# Patient Record
Sex: Female | Born: 1985 | Race: White | Hispanic: No | Marital: Single | Smoking: Never smoker
Health system: Southern US, Community
[De-identification: ages and names within clinical notes are randomized; demographics above are authoritative.]

## PROBLEM LIST (undated history)

## (undated) DIAGNOSIS — R740 Nonspecific elevation of levels of transaminase and lactic acid dehydrogenase [LDH]: Secondary | ICD-10-CM

## (undated) DIAGNOSIS — IMO0002 Reserved for concepts with insufficient information to code with codable children: Secondary | ICD-10-CM

## (undated) DIAGNOSIS — J302 Other seasonal allergic rhinitis: Secondary | ICD-10-CM

## (undated) DIAGNOSIS — D649 Anemia, unspecified: Secondary | ICD-10-CM

## (undated) DIAGNOSIS — R87619 Unspecified abnormal cytological findings in specimens from cervix uteri: Secondary | ICD-10-CM

## (undated) DIAGNOSIS — K900 Celiac disease: Secondary | ICD-10-CM

## (undated) DIAGNOSIS — F329 Major depressive disorder, single episode, unspecified: Secondary | ICD-10-CM

## (undated) DIAGNOSIS — S62101A Fracture of unspecified carpal bone, right wrist, initial encounter for closed fracture: Secondary | ICD-10-CM

## (undated) DIAGNOSIS — E039 Hypothyroidism, unspecified: Secondary | ICD-10-CM

## (undated) DIAGNOSIS — K589 Irritable bowel syndrome without diarrhea: Secondary | ICD-10-CM

## (undated) DIAGNOSIS — E063 Autoimmune thyroiditis: Secondary | ICD-10-CM

## (undated) DIAGNOSIS — F32A Depression, unspecified: Secondary | ICD-10-CM

## (undated) DIAGNOSIS — G43109 Migraine with aura, not intractable, without status migrainosus: Secondary | ICD-10-CM

## (undated) HISTORY — DX: Migraine with aura, not intractable, without status migrainosus: G43.109

## (undated) HISTORY — DX: Autoimmune thyroiditis: E06.3

## (undated) HISTORY — DX: Reserved for concepts with insufficient information to code with codable children: IMO0002

## (undated) HISTORY — DX: Irritable bowel syndrome without diarrhea: K58.9

## (undated) HISTORY — DX: Anemia, unspecified: D64.9

## (undated) HISTORY — DX: Hypothyroidism, unspecified: E03.9

## (undated) HISTORY — DX: Nonspecific elevation of levels of transaminase and lactic acid dehydrogenase (ldh): R74.0

## (undated) HISTORY — DX: Celiac disease: K90.0

## (undated) HISTORY — DX: Fracture of unspecified carpal bone, right wrist, initial encounter for closed fracture: S62.101A

## (undated) HISTORY — DX: Other seasonal allergic rhinitis: J30.2

## (undated) HISTORY — PX: WISDOM TOOTH EXTRACTION: SHX21

## (undated) HISTORY — DX: Major depressive disorder, single episode, unspecified: F32.9

## (undated) HISTORY — PX: COLPOSCOPY: SHX161

## (undated) HISTORY — DX: Depression, unspecified: F32.A

## (undated) HISTORY — DX: Unspecified abnormal cytological findings in specimens from cervix uteri: R87.619

---
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
## 2009-05-16 ENCOUNTER — Ambulatory Visit: Payer: Self-pay | Admitting: Internal Medicine

## 2009-05-16 DIAGNOSIS — D509 Iron deficiency anemia, unspecified: Secondary | ICD-10-CM

## 2009-05-16 DIAGNOSIS — Z9189 Other specified personal risk factors, not elsewhere classified: Secondary | ICD-10-CM | POA: Insufficient documentation

## 2009-05-16 DIAGNOSIS — J302 Other seasonal allergic rhinitis: Secondary | ICD-10-CM | POA: Insufficient documentation

## 2009-05-16 DIAGNOSIS — G479 Sleep disorder, unspecified: Secondary | ICD-10-CM | POA: Insufficient documentation

## 2009-05-16 DIAGNOSIS — M7120 Synovial cyst of popliteal space [Baker], unspecified knee: Secondary | ICD-10-CM | POA: Insufficient documentation

## 2009-05-20 ENCOUNTER — Encounter (INDEPENDENT_AMBULATORY_CARE_PROVIDER_SITE_OTHER): Payer: Self-pay | Admitting: *Deleted

## 2009-05-20 LAB — CONVERTED CEMR LAB
ALT: 110 units/L — ABNORMAL HIGH (ref 0–35)
AST: 66 units/L — ABNORMAL HIGH (ref 0–37)
Albumin: 4 g/dL (ref 3.5–5.2)
Alkaline Phosphatase: 78 units/L (ref 39–117)
BUN: 10 mg/dL (ref 6–23)
Basophils Absolute: 0 10*3/uL (ref 0.0–0.1)
Basophils Relative: 0.6 % (ref 0.0–3.0)
Bilirubin, Direct: 0 mg/dL (ref 0.0–0.3)
CO2: 20 meq/L (ref 19–32)
Calcium: 9.9 mg/dL (ref 8.4–10.5)
Chloride: 104 meq/L (ref 96–112)
Cholesterol: 196 mg/dL (ref 0–200)
Creatinine, Ser: 0.6 mg/dL (ref 0.4–1.2)
Eosinophils Absolute: 0.1 10*3/uL (ref 0.0–0.7)
Eosinophils Relative: 1.3 % (ref 0.0–5.0)
GFR calc non Af Amer: 133.74 mL/min (ref >60)
Glucose, Bld: 80 mg/dL (ref 70–99)
HCT: 41.8 % (ref 36.0–46.0)
HDL: 47.9 mg/dL (ref >39.00)
Hemoglobin: 14.2 g/dL (ref 12.0–15.0)
LDL Cholesterol: 126 mg/dL — ABNORMAL HIGH (ref 0–99)
Lymphocytes Relative: 20.4 % (ref 12.0–46.0)
Lymphs Abs: 1.1 10*3/uL (ref 0.7–4.0)
MCHC: 34 g/dL (ref 30.0–36.0)
MCV: 88.3 fL (ref 78.0–100.0)
Monocytes Absolute: 0.8 10*3/uL (ref 0.1–1.0)
Monocytes Relative: 14 % — ABNORMAL HIGH (ref 3.0–12.0)
Neutro Abs: 3.4 10*3/uL (ref 1.4–7.7)
Neutrophils Relative %: 63.7 % (ref 43.0–77.0)
Platelets: 247 10*3/uL (ref 150.0–400.0)
Potassium: 4.7 meq/L (ref 3.5–5.1)
RBC: 4.73 M/uL (ref 3.87–5.11)
RDW: 11.5 % (ref 11.5–14.6)
Sodium: 140 meq/L (ref 135–145)
TSH: 7.72 microintl units/mL — ABNORMAL HIGH (ref 0.35–5.50)
Total Bilirubin: 0.4 mg/dL (ref 0.3–1.2)
Total CHOL/HDL Ratio: 4
Total Protein: 7.5 g/dL (ref 6.0–8.3)
Triglycerides: 113 mg/dL (ref 0.0–149.0)
VLDL: 22.6 mg/dL (ref 0.0–40.0)
WBC: 5.4 10*3/uL (ref 4.5–10.5)

## 2009-06-13 ENCOUNTER — Encounter (INDEPENDENT_AMBULATORY_CARE_PROVIDER_SITE_OTHER): Payer: Self-pay | Admitting: *Deleted

## 2009-06-13 ENCOUNTER — Ambulatory Visit: Payer: Self-pay | Admitting: Internal Medicine

## 2009-06-16 ENCOUNTER — Telehealth (INDEPENDENT_AMBULATORY_CARE_PROVIDER_SITE_OTHER): Payer: Self-pay | Admitting: *Deleted

## 2009-06-16 LAB — CONVERTED CEMR LAB
ALT: 162 units/L — ABNORMAL HIGH (ref 0–35)
AST: 86 units/L — ABNORMAL HIGH (ref 0–37)
Free T4: 0.7 ng/dL (ref 0.6–1.6)
T3, Free: 4.5 pg/mL — ABNORMAL HIGH (ref 2.3–4.2)
TSH: 13.92 microintl units/mL — ABNORMAL HIGH (ref 0.35–5.50)

## 2009-06-18 ENCOUNTER — Ambulatory Visit: Payer: Self-pay | Admitting: Internal Medicine

## 2009-06-18 DIAGNOSIS — R740 Nonspecific elevation of levels of transaminase and lactic acid dehydrogenase [LDH]: Secondary | ICD-10-CM

## 2009-06-18 DIAGNOSIS — R7401 Elevation of levels of liver transaminase levels: Secondary | ICD-10-CM | POA: Insufficient documentation

## 2009-06-18 DIAGNOSIS — E039 Hypothyroidism, unspecified: Secondary | ICD-10-CM

## 2009-08-12 ENCOUNTER — Ambulatory Visit: Payer: Self-pay | Admitting: Internal Medicine

## 2009-08-15 ENCOUNTER — Encounter (INDEPENDENT_AMBULATORY_CARE_PROVIDER_SITE_OTHER): Payer: Self-pay | Admitting: *Deleted

## 2009-08-15 LAB — CONVERTED CEMR LAB
ALT: 24 units/L (ref 0–35)
AST: 19 units/L (ref 0–37)
Albumin: 3.8 g/dL (ref 3.5–5.2)
Alkaline Phosphatase: 56 units/L (ref 39–117)
Bilirubin, Direct: 0 mg/dL (ref 0.0–0.3)
TSH: 4.07 microintl units/mL (ref 0.35–5.50)
Total Bilirubin: 0.7 mg/dL (ref 0.3–1.2)
Total Protein: 7.5 g/dL (ref 6.0–8.3)

## 2009-08-18 ENCOUNTER — Ambulatory Visit: Payer: Self-pay | Admitting: Internal Medicine

## 2009-09-29 ENCOUNTER — Encounter: Admission: RE | Admit: 2009-09-29 | Discharge: 2009-09-29 | Payer: Self-pay | Admitting: Infectious Diseases

## 2010-02-16 ENCOUNTER — Ambulatory Visit: Payer: Self-pay | Admitting: Internal Medicine

## 2010-02-23 LAB — CONVERTED CEMR LAB
ALT: 23 units/L (ref 0–35)
AST: 21 units/L (ref 0–37)
Albumin: 4.3 g/dL (ref 3.5–5.2)
Alkaline Phosphatase: 61 units/L (ref 39–117)
Bilirubin, Direct: 0.1 mg/dL (ref 0.0–0.3)
TSH: 3.65 microintl units/mL (ref 0.35–5.50)
Total Bilirubin: 0.7 mg/dL (ref 0.3–1.2)
Total Protein: 7.4 g/dL (ref 6.0–8.3)

## 2010-07-20 ENCOUNTER — Ambulatory Visit: Payer: Self-pay | Admitting: Internal Medicine

## 2010-07-20 DIAGNOSIS — M25569 Pain in unspecified knee: Secondary | ICD-10-CM

## 2010-07-20 DIAGNOSIS — R7401 Elevation of levels of liver transaminase levels: Secondary | ICD-10-CM

## 2010-07-20 HISTORY — DX: Elevation of levels of liver transaminase levels: R74.01

## 2010-07-20 LAB — CONVERTED CEMR LAB
Cholesterol, target level: 200 mg/dL
HDL goal, serum: 40 mg/dL
LDL Goal: 160 mg/dL

## 2010-07-27 LAB — CONVERTED CEMR LAB
ALT: 47 units/L — ABNORMAL HIGH (ref 0–35)
AST: 30 units/L (ref 0–37)
BUN: 10 mg/dL (ref 6–23)
Basophils Relative: 0.3 % (ref 0.0–3.0)
Bilirubin, Direct: 0.1 mg/dL (ref 0.0–0.3)
Chloride: 102 meq/L (ref 96–112)
Cholesterol: 209 mg/dL — ABNORMAL HIGH (ref 0–200)
Direct LDL: 150.2 mg/dL
Eosinophils Relative: 3 % (ref 0.0–5.0)
GFR calc non Af Amer: 103.86 mL/min (ref >60.00)
HCT: 40 % (ref 36.0–46.0)
Lymphs Abs: 1.6 10*3/uL (ref 0.7–4.0)
MCV: 86.2 fL (ref 78.0–100.0)
Monocytes Absolute: 0.4 10*3/uL (ref 0.1–1.0)
Monocytes Relative: 6.1 % (ref 3.0–12.0)
Platelets: 354 10*3/uL (ref 150.0–400.0)
Potassium: 4.3 meq/L (ref 3.5–5.1)
RBC: 4.65 M/uL (ref 3.87–5.11)
Sodium: 140 meq/L (ref 135–145)
TSH: 11.75 microintl units/mL — ABNORMAL HIGH (ref 0.35–5.50)
Total Bilirubin: 0.3 mg/dL (ref 0.3–1.2)
Total Protein: 7.6 g/dL (ref 6.0–8.3)
Triglycerides: 129 mg/dL (ref 0.0–149.0)
WBC: 6.3 10*3/uL (ref 4.5–10.5)

## 2010-08-09 HISTORY — PX: KNEE ARTHROSCOPY: SUR90

## 2010-08-24 ENCOUNTER — Encounter: Payer: Self-pay | Admitting: Internal Medicine

## 2010-09-07 ENCOUNTER — Encounter: Payer: Self-pay | Admitting: Internal Medicine

## 2010-09-08 NOTE — Assessment & Plan Note (Signed)
Summary: 8 wk followup/alr   Vital Signs:  Patient profile:   25 year old female Height:      68.25 inches Weight:      221 pounds Temp:     98.2 degrees F oral Pulse rate:   72 / minute Resp:     15 per minute BP sitting:   90 / 62  (left arm)  Vitals Entered By: Jeremy Johann CMA (August 18, 2009 3:59 PM) CC: f/u labs, tb skin test Comments REVIEWED MED LIST, PATIENT AGREED DOSE AND INSTRUCTION CORRECT    CC:  f/u labs and tb skin test.  History of Present Illness: Labs reviewed ; weight stable BUT waist down 2 inches with decreased hyperglycemic carbs & High Fructose Corn Syrup.  Allergies (verified): No Known Drug Allergies  Review of Systems General:  Complains of fatigue; Denies weight loss despite waist change. Eyes:  Denies blurring, double vision, and vision loss-both eyes. ENT:  Denies difficulty swallowing and hoarseness. GI:  Denies constipation and diarrhea. Derm:  Complains of dryness; denies changes in nail beds and hair loss. Neuro:  Denies numbness and tingling. Endo:  Complains of cold intolerance; denies heat intolerance.  Physical Exam  General:  well-nourished,in no acute distress; alert,appropriate and cooperative throughout examination Eyes:  No corneal or conjunctival inflammation noted. No lid lag.Perrla.  Neck:  No deformities, masses, or tenderness noted. Heart:  Normal rate and regular rhythm. S1 and S2 normal without gallop, murmur, click, rub . S4 with slurring Neurologic:  alert & oriented X3 and DTRs symmetrical and normal.  No tremor Skin:  Intact without suspicious lesions or rashes Psych:  memory intact for recent and remote, normally interactive, and good eye contact.     Impression & Recommendations:  Problem # 1:  NONSPEC ELEVATION OF LEVELS OF TRANSAMINASE/LDH (ICD-790.4) Resolved  Problem # 2:  HYPOTHYROIDISM (ICD-244.9) TSH high normal Her updated medication list for this problem includes:    Levothyroxine Sodium 50 Mcg  Tabs (Levothyroxine sodium) .Marland Kitchen... 1 once daily except 1& 1/2 m & f  Complete Medication List: 1)  Levothyroxine Sodium 50 Mcg Tabs (Levothyroxine sodium) .Marland Kitchen.. 1 once daily except 1& 1/2 m & f  Patient Instructions: 1)  Please schedule a follow-up appointment in 6 months. 2)  Hepatic Panel prior to visit, ICD-9:790.4 3)  TSH prior to visit, ICD-9:244.9 Prescriptions: LEVOTHYROXINE SODIUM 50 MCG TABS (LEVOTHYROXINE SODIUM) 1 once daily EXCEPT 1& 1/2 M & F  #90 x 1   Entered and Authorized by:   Marga Melnick MD   Signed by:   Marga Melnick MD on 08/18/2009   Method used:   Faxed to ...       [facility] [address] #[id number]* (retail)       [address] [place], [place]  [address]       Ph: [phone]       Fax: [phone]   RxID:   [id number]   Appended Document: Orders Update    Clinical Lists Changes  Orders: Added new Service order of TB Skin Test [phone]) - Signed Added new Service order of Admin 1st Vaccine (95638) - Signed Added new Service order of Admin 1st Vaccine [facility]) [phone]) - Signed Observations: Added new observation of TB-PPD LOT#: [id number] (08/18/2009 16:42) Added new observation of TB-PPD EXP: 12/03/2011 (08/18/2009 16:42) Added new observation of TB-PPD BY: Felecia Deloach CMA (08/18/2009 16:42) Added new observation of TB-PPD RTE: ID (08/18/2009 16:42) Added new observation  of TB-PPD MFR: Sanofi Pasteur (08/18/2009 16:42) Added new observation of TB-PPD SITE: left forearm (08/18/2009 16:42) Added new observation of TB-PPD: PPD (08/18/2009 16:42)      Laboratory Results       Orders Added: 1)  TB Skin Test [86580] 2)  Admin 1st Vaccine [90471] 3)  Admin 1st Vaccine [facility]) [90471S]    PPD Application    Vaccine Type: PPD    Site: left forearm    Mfr: Sanofi Pasteur    Dose: 0.1 ml    Route: ID    Given by: Jeremy Johann CMA    Exp. Date: 12/03/2011    Lot #: [id number]  Appended Document: 8 wk followup/alr   PPD  Results    Date of reading: 08/20/2009    Results: < 5mm    Interpretation: negative

## 2010-09-08 NOTE — Letter (Signed)
Summary: Results Follow up Letter  [facility] [place]  [address] [place], [place] [address]   Phone: [phone]  Fax: [phone]    08/15/2009 MRN: [id number]     Lurlean Germond [address] [place], [place]  [address]  Dear Ms. Offer,  The following are the results of your recent test(s):  Test         Result    Pap Smear:        Normal _____  Not Normal _____ Comments: ______________________________________________________ Cholesterol: LDL(Bad cholesterol):         Your goal is less than:         HDL (Good cholesterol):       Your goal is more than: Comments:  ______________________________________________________ Mammogram:        Normal _____  Not Normal _____ Comments:  ___________________________________________________________________ Hemoccult:        Normal _____  Not normal _______ Comments:    _____________________________________________________________________ Other Tests:  SEE ATTACHED LAB AND HIGHLIGHTED COMMENTS  We routinely do not discuss normal results over the telephone.  If you desire a copy of the results, or you have any questions about this information we can discuss them at your next office visit.   Sincerely,

## 2010-09-10 NOTE — Assessment & Plan Note (Signed)
Summary: cpx/fasting//kn   Vital Signs:  Patient profile:   25 year old female Weight:      229.4 pounds BMI:     34.75 Temp:     97.9 degrees F oral Pulse rate:   72 / minute BP sitting:   114 / 70  (left arm) Cuff size:   large  Vitals Entered By: Shonna Chock CMA (July 20, 2010 1:08 PM)  CC: Lipid Management   CC:  Lipid Management.  History of Present Illness:    Gwendolyn [name] is here for a physical; she has had intermittent dyspnea in the context of  dry cough for 1 week.  The patient reports nasal congestion, but denies purulent nasal discharge, sore throat, and earache.  The patient denies fever and wheezing.  The patient denies itchy watery eyes, sneezing, and headache.  Risk factors for Strep sinusitis include tender adenopathy.  The patient denies the following risk factors for Strep sinusitis: bilateral facial pain and tooth pain.  No PMH of asthma.OTC cough meds didn't help.  Lipid Management History:      Negative NCEP/ATP III risk factors include female age less than 47 years old, non-diabetic, no family history for ischemic heart disease, non-tobacco-user status, non-hypertensive, no ASHD (atherosclerotic heart disease), no prior stroke/TIA, no peripheral vascular disease, and no history of aortic aneurysm.     Current Medications (verified): 1)  Levothyroxine Sodium 50 Mcg Tabs (Levothyroxine Sodium) .Marland Kitchen.. 1 Once Daily Except 1& 1/2 M & F 2)  Loestrin Fe 1.5/30 1.5-30 Mg-Mcg Tabs (Norethin Ace-Eth Estrad-Fe) .Marland Kitchen.. 1 By Mouth Once Daily  Allergies (verified): No Known Drug Allergies  Past History:  Past Medical History: Allergic rhinitis, seasonal Anemia-iron deficiency, PMH of , 2008,  Dr Genice Rouge Hypothyroidism Hyperlipidemia: 2010: LDL 126, HDL 47.90. Framingham Study LDL goal = < 160.  Past Surgical History: Wisdom  Teeth  Extraction , PMH of  Family History: Father:asthma, DM,dysrrhythmias Mother: Depression, PMH of ;  ZHY:QMVHQI? type; ONG:EXBM cancer  (smoker) Siblings: 1 Brother   Social History: Occupation:CMA Single Never Smoked Alcohol use-no Regular exercise: walking 1-2  mpd   2-3X/ week & biking  Review of Systems General:  Denies chills, fatigue, fever, sweats, and weight loss. Eyes:  Denies blurring, double vision, and vision loss-both eyes. ENT:  Denies difficulty swallowing and hoarseness. CV:  Denies chest pain or discomfort, palpitations, shortness of breath with exertion, swelling of feet, and swelling of hands. Resp:  Denies chest pain with inspiration and coughing up blood. GI:  Denies abdominal pain, bloody stools, constipation, dark tarry stools, diarrhea, and indigestion. GU:  Denies discharge, dysuria, and hematuria. MS:  Complains of joint pain and loss of strength; L knee pain & intermittent swelling; occasional weakness. No known injury. Icy Hot & knee brace help.. Derm:  Denies changes in nail beds, dryness, hair loss, and lesion(s). Neuro:  Denies numbness, tingling, and weakness. Psych:  Denies anxiety, depression, easily angered, easily tearful, and irritability. Endo:  Complains of cold intolerance; denies excessive hunger, excessive thirst, excessive urination, and heat intolerance. Heme:  Denies abnormal bruising and bleeding. Allergy:  Complains of seasonal allergies; denies itching eyes and sneezing.  Physical Exam  General:  well-nourished,in no acute distress; alert,appropriate and cooperative throughout examination Head:  Normocephalic and atraumatic without obvious abnormalities.  Eyes:  No corneal or conjunctival inflammation noted.  Perrla. Funduscopic exam benign, without hemorrhages, exudates or papilledema. Ears:  External ear exam shows no significant lesions or deformities.   Hearing is grossly normal bilaterally.L  ear normal and R cerumen impaction.   Nose:  External nasal examination shows no deformity or inflammation. Nasal mucosa are pink and moist without lesions or exudates. Mouth:   Oral mucosa and oropharynx without lesions or exudates.  Teeth in good repair. Festive braces  Neck:  No deformities, masses, or tenderness noted. Lungs:  Normal respiratory effort, chest expands symmetrically. Lungs are clear to auscultation, no crackles or wheezes. Heart:  Normal rate and regular rhythm. S1 and S2 normal without gallop, murmur, click, rub or other extra sounds. Abdomen:  Bowel sounds positive,abdomen soft and non-tender without masses, organomegaly or hernias noted. Genitalia:  Dr Tresa Res Msk:  No deformity or scoliosis noted of thoracic or lumbar spine.   Pulses:  R and L carotid,radial,femoral,dorsalis pedis and posterior tibial pulses are full and equal bilaterally Extremities:  No clubbing, cyanosis, edema. Mild varus knee changes; ? small effusion L knee Neurologic:  alert & oriented X3 and DTRs symmetrical and normal.   Skin:  Intact without suspicious lesions or rashes Cervical Nodes:  No lymphadenopathy noted Axillary Nodes:  No palpable lymphadenopathy Psych:  memory intact for recent and remote, normally interactive, and good eye contact.     Impression & Recommendations:  Problem # 1:  ROUTINE GENERAL MEDICAL EXAM@HEALTH  CARE FACL (ICD-V70.0)  Orders: Venipuncture (72536) TLB-Lipid Panel (80061-LIPID) TLB-BMP (Basic Metabolic Panel-BMET) (80048-METABOL) TLB-CBC Platelet - w/Differential (85025-CBCD) TLB-Hepatic/Liver Function Pnl (80076-HEPATIC) TLB-TSH (Thyroid Stimulating Hormone) (84443-TSH)  Problem # 2:  COUGH (ICD-786.2) bronchitis with atypical organism  suggested  Problem # 3:  KNEE PAIN, LEFT (ICD-719.46)  Orders: Orthopedic Referral (Ortho)  Problem # 4:  HYPOTHYROIDISM (ICD-244.9)  Her updated medication list for this problem includes:    Levothyroxine Sodium 50 Mcg Tabs (Levothyroxine sodium) .Marland Kitchen... 1 once daily except 1& 1/2 m & f  Complete Medication List: 1)  Levothyroxine Sodium 50 Mcg Tabs (Levothyroxine sodium) .Marland Kitchen.. 1 once daily  except 1& 1/2 m & f 2)  Loestrin Fe 1.5/30 1.5-30 Mg-mcg Tabs (Norethin ace-eth estrad-fe) .Marland Kitchen.. 1 by mouth once daily 3)  Azithromycin 250 Mg Tabs (Azithromycin) .... As per pack 4)  Benzonatate 200 Mg Caps (Benzonatate) .Marland Kitchen.. 1 every 6-8 hrs as needed for cough  Lipid Assessment/Plan:      Based on NCEP/ATP III, the patient's risk factor category is "0-1 risk factors".  The patient's lipid goals are as follows: Total cholesterol goal is 200; LDL cholesterol goal is 160; HDL cholesterol goal is 40; Triglyceride goal is 150.    Patient Instructions: 1)  Drink as  NON dairy much fluid as you can tolerate for the next few days. Neti pot once daily as needed for head congestion. Prescriptions: BENZONATATE 200 MG CAPS (BENZONATATE) 1 every 6-8 hrs as needed for cough  #15 x 0   Entered and Authorized by:   Marga Melnick MD   Signed by:   Marga Melnick MD on 07/20/2010   Method used:   Faxed to ...       [facility] #[id number]* (retail)       [address]       [place], [place]  [address]       Ph: [phone]       Fax: [phone]   RxID:   [id number] AZITHROMYCIN 250 MG TABS (AZITHROMYCIN) as per pack  #1 x 0   Entered and Authorized by:   Marga Melnick MD   Signed by:   Chrissie Noa  Hopper MD on 07/20/2010   Method used:   Faxed to ...       [facility] #[id number]* (retail)       [address]       [place], [place]  [address]       Ph: [phone]       Fax: [phone]   RxID:   [id number]    Orders Added: 1)  Est. Patient 18-39 years [99395] 2)  Venipuncture [71696] 3)  TLB-Lipid Panel [80061-LIPID] 4)  TLB-BMP (Basic Metabolic Panel-BMET) [80048-METABOL] 5)  TLB-CBC Platelet - w/Differential [85025-CBCD] 6)  TLB-Hepatic/Liver Function Pnl [80076-HEPATIC] 7)  TLB-TSH (Thyroid Stimulating Hormone) [84443-TSH] 8)  Orthopedic Referral [Ortho]

## 2010-09-11 ENCOUNTER — Encounter: Payer: Self-pay | Admitting: Internal Medicine

## 2010-09-16 NOTE — Consult Note (Signed)
Summary: [facility]   Imported By: Lanelle Bal 09/07/2010 10:29:24  _____________________________________________________________________  External Attachment:    Type:   Image     Comment:   External Document

## 2010-09-24 NOTE — Letter (Signed)
Summary: [facility]   Imported By: Maryln Gottron 09/18/2010 09:55:42  _____________________________________________________________________  External Attachment:    Type:   Image     Comment:   External Document

## 2010-09-25 ENCOUNTER — Ambulatory Visit (HOSPITAL_BASED_OUTPATIENT_CLINIC_OR_DEPARTMENT_OTHER)
Admission: RE | Admit: 2010-09-25 | Discharge: 2010-09-25 | Disposition: A | Discharge status: Home / Self Care | Payer: BC Managed Care – PPO | Source: Ambulatory Visit | Attending: Specialist | Admitting: Specialist

## 2010-09-25 DIAGNOSIS — S83509A Sprain of unspecified cruciate ligament of unspecified knee, initial encounter: Secondary | ICD-10-CM | POA: Insufficient documentation

## 2010-09-25 DIAGNOSIS — M6580 Other synovitis and tenosynovitis, unspecified site: Secondary | ICD-10-CM | POA: Insufficient documentation

## 2010-09-25 DIAGNOSIS — M2240 Chondromalacia patellae, unspecified knee: Secondary | ICD-10-CM | POA: Insufficient documentation

## 2010-09-25 DIAGNOSIS — X58XXXA Exposure to other specified factors, initial encounter: Secondary | ICD-10-CM | POA: Insufficient documentation

## 2010-09-28 LAB — POCT PREGNANCY, URINE: Preg Test, Ur: NEGATIVE

## 2010-09-30 NOTE — Letter (Signed)
Summary: [facility]   Imported By: Maryln Gottron 09/23/2010 15:09:38  _____________________________________________________________________  External Attachment:    Type:   Image     Comment:   External Document

## 2010-10-07 NOTE — Op Note (Signed)
[name], [name]                 ACCOUNT NO.:  [id number]  MEDICAL RECORD NO.:  [id number]           PATIENT TYPE:  LOCATION:                                 FACILITY:  PHYSICIAN:  Jene Every, M.D.    DATE OF BIRTH:  1985-12-27  DATE OF PROCEDURE:  09/25/2010 DATE OF DISCHARGE:                              OPERATIVE REPORT   PREOPERATIVE DIAGNOSES: 1. Synovitis of the left knee. 2. Partial tear of the anterior cruciate ligament.  POSTOPERATIVE DIAGNOSIS: 1. Synovitis of the left knee. 2. Chondromalacia of the patellofemoral joint.  PROCEDURE PERFORMED: 1. Left knee arthroscopy. 2. Shaving the hypertrophic synovitis between the lateral facet and     lateral femoral condyle, lateral compartment. 3. Exam under anesthesia.  BRIEF HISTORY:  [age]-year-old with knee pain, chronic patellofemoral pain, compression possible occult ACL tear due to translation of the tibia on the femur.  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  Risk and benefits were discussed, bleeding, infection, no change in symptoms, worsening of symptoms, need for repeat debridement, DVT, PE, anesthetic complications, etc.,  TECHNIQUE:  Patient in supine position, after induction of adequate general anesthesia and 3 grams Kefzol, the left lower extremity was prepped and draped in the usual sterile fashion.  Just prior to that, we examined her under anesthesia.  She did have external rotation of the hips bilaterally, a slight genu valgum bilateral, slight recur bottom, 1+ Lachman with good endpoint bilaterally.  No instability of the varus valgus on stressing to 0 and 30 degrees.  She had normal patellofemoral tracking.  Next, after prepping and draping of the left lower  extremity, a lateral parapatellar portal and superomedial parapatellar portal was fashioned with #11 blade.  Ingress cannula atraumatically placed. Irrigant was utilized to insufflate the joint.  Under direct visualization, an 18 gauge needle was utilized to localize the medial parapatellar portal, sparing the medial meniscus.  I fashioned the portal with a #11 blade sparing the medial meniscus.  Examination of the suprapatellar pouch revealed hypertrophic synovitis.  The lateral femoral condyle and facet were shaved with 3.5 Cuda shaver.  There was normal patellofemoral tracking.  Gutters were unremarkable.  Lateral compartment revealed some hypertrophic synovitis within the notch and towards the lateral compartment.  This was shaved as well. Hypertrophic synovitis was removed from the lateral compartment as well. Medial compartment revealed normal femoral condyle, tibial plateau and meniscus stable to probe palpation.  No evidence of tearing.  There was no tear of the lateral meniscus either.  PCL was not visualized.  She had a punctate area of grade III chondromalacia on the apex of the patella; however, it was not full-thickness.  She also had an area over the lateral femoral condyle with slight indentation indicating a chondral lesion, but no osteochondral defect.  All were stable.  After the debridement,  I reexamined all compartments.  No further pathology amenable for arthroscopic intervention therefore removed all instrumentation and portals were closed with 4-0  nylon sutures.  The 0.25% Marcaine with epinephrine was infiltrated in the joints.  The wound was dressed sterilely without difficulty and transported to recovery in satisfactory condition.  Patient tolerated the procedure well.  There were no complications.  ASSISTANT:  No assistant.     Jene Every, M.D.     [name]  D:  09/25/2010  T:  09/25/2010  Job:  [id number]  Electronically Signed by Jene Every  M.D. on 10/07/2010 09:19:46 AM

## 2011-03-11 ENCOUNTER — Other Ambulatory Visit: Payer: Self-pay | Admitting: Internal Medicine

## 2011-03-11 NOTE — Telephone Encounter (Signed)
Has pending appt for 07/2011

## 2011-07-20 ENCOUNTER — Encounter: Payer: Self-pay | Admitting: Internal Medicine

## 2011-07-22 ENCOUNTER — Encounter: Payer: BC Managed Care – PPO | Admitting: Internal Medicine

## 2011-09-16 ENCOUNTER — Ambulatory Visit (INDEPENDENT_AMBULATORY_CARE_PROVIDER_SITE_OTHER): Payer: Commercial Managed Care - PPO | Admitting: Internal Medicine

## 2011-09-16 ENCOUNTER — Encounter: Payer: Self-pay | Admitting: Internal Medicine

## 2011-09-16 DIAGNOSIS — R079 Chest pain, unspecified: Secondary | ICD-10-CM

## 2011-09-16 DIAGNOSIS — Z0000 Encounter for general adult medical examination without abnormal findings: Secondary | ICD-10-CM

## 2011-09-16 DIAGNOSIS — R42 Dizziness and giddiness: Secondary | ICD-10-CM

## 2011-09-16 LAB — CBC WITH DIFFERENTIAL/PLATELET
Basophils Relative: 0.8 % (ref 0.0–3.0)
Eosinophils Absolute: 0.3 10*3/uL (ref 0.0–0.7)
Eosinophils Relative: 6.3 % — ABNORMAL HIGH (ref 0.0–5.0)
Lymphocytes Relative: 20.5 % (ref 12.0–46.0)
Monocytes Relative: 9.7 % (ref 3.0–12.0)
Neutrophils Relative %: 62.7 % (ref 43.0–77.0)
RBC: 4.38 Mil/uL (ref 3.87–5.11)
WBC: 5.5 10*3/uL (ref 4.5–10.5)

## 2011-09-16 LAB — BASIC METABOLIC PANEL
Calcium: 9.2 mg/dL (ref 8.4–10.5)
Creatinine, Ser: 0.7 mg/dL (ref 0.4–1.2)
Sodium: 138 mEq/L (ref 135–145)

## 2011-09-16 LAB — HEPATIC FUNCTION PANEL
ALT: 25 U/L (ref 0–35)
AST: 25 U/L (ref 0–37)
Alkaline Phosphatase: 70 U/L (ref 39–117)
Bilirubin, Direct: 0.1 mg/dL (ref 0.0–0.3)
Total Protein: 7.9 g/dL (ref 6.0–8.3)

## 2011-09-16 LAB — LIPID PANEL
HDL: 48.8 mg/dL (ref >39.00)
Total CHOL/HDL Ratio: 3

## 2011-09-16 LAB — TSH: TSH: 4.93 u[IU]/mL (ref 0.35–5.50)

## 2011-09-16 MED ORDER — RANITIDINE HCL 150 MG PO TABS: 150.0000 mg | ORAL_TABLET | Freq: Two times a day (BID) | ORAL | Status: DC

## 2011-09-16 NOTE — Patient Instructions (Addendum)
Preventive Health Care: Exercise  30-45  minutes a day, 3-4 days a week. Walking is especially valuable in preventing Osteoporosis. Eat a low-fat diet with lots of fruits and vegetables, up to 7-9 servings per day. Avoid obesity; your goal = waist less than 35 inches.Consume less than 30 grams of sugar per day from foods & drinks with High Fructose Corn Syrup as # 1,2,3 or #4 on label.  The triggers for reflux  include stress; the "aspirin family" ; alcohol; peppermint; and caffeine (coffee, tea, cola, and chocolate). The aspirin family would include aspirin and the nonsteroidal agents such as ibuprofen &  Naproxen. Tylenol would not cause reflux. If having symptoms ; food & drink should be avoided for @ least 2 hours before going to bed.  Please keep a diary of symptoms and signs. Enter significant symptoms, frequency, severity ( 1-10 scale), possible triggers, and response to medication, exercise or other therapeutic options as discussed.

## 2011-09-16 NOTE — Progress Notes (Signed)
Subjective:    Patient ID: Gwendolyn [name], female    DOB: [birth year], 26 y.o.   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 Duration: 10-15 min Benign positional vertigo symptoms: yes Straining: no Pain: no headache, nausea Cardiac prodrome: Palpitations sometimes irregular rhythm no heart rate change yes Neurologic prodrome: headache no  numbness and tingling no weakness no change in coordination (gait/falling) feels off balance during dizzy spells Syncope: presyncope but has not passed out Seizure activity: no Upper respiratory tract infection/extrinsic symptoms: has seasonal allergies Associated signs and symptoms: Visual change (blurred/double/loss): no Hearing loss/tinnitus: no "hearing through a tunnel"  Nausea/sweating: yes nausea; no sweating Chest pain: yes epigastric - sharp Dyspnea: no Treatment/response: hasn't tried anything no hx of motion sickness.  #2 CHEST PAIN: Location:  epigrastric radiates more to the left and to shoulder blades Quality: sharp pain   Duration: 1-2 minutes   Onset (rest, exertion): at work or walking, has happened 1-2X at rest; not really related to intake Radiation: notices it radiates to left side of ribs Better with: none   Worse with: nothing  that pt can think of;does not hurt to take deep breaths Symptoms History of Trauma/lifting: breaking wrist and knee surgery Nausea/vomiting: yes nausea; no vomiting Diaphoresis: no Shortness of breath: no Pleuritic: no Cough: during cold a few weeks ago, not now Edema: only after working 12 hours, feet feel cold almost constantly - needs thick pair of socks, no color changes, fingertips get cold too but no color changes - Orthopnea: no PND: no Dizziness: yes Palpitations: yes Syncope: presyncope Indigestion:  possiblely; no acid reflux only with spicy food Red Flags Worse with exertion: unable to tell due to chest pain going so quickly - when hcest pain occurs, to has to sit down, catches pt "off guard" Recent Immobility: 3 weeks off work, confined to bed for a few days - last year in february - none recently Cancer history: no Tearing/radiation to back: no   Objective:   Physical Exam                    Gen.: Healthy and well-nourished in appearance. Alert, appropriate and cooperative throughout exam. Head: Normocephalic without obvious abnormalities; no alopecia Eyes: No corneal or conjunctival inflammation noted. Pupils equal round reactive to light and accommodation. Fundal exam is benign without hemorrhages, exudate, papilledema. Extraocular motion intact. Minimal OS ptosis.Vision grossly normal. FOV normal Ears: External  ear exam reveals no significant lesions or deformities. Canals clear, slight erythema on left .TMs normal and pearly grey. Hearing is grossly normal bilaterally. Air conduction greater than bone conduction bilaterally with tuning fork. No lateralization Nose: External nasal exam reveals no deformity or inflammation. Nasal mucosa are pink and moist. No lesions or exudates noted.  Mouth: Oral mucosa and oropharynx reveal no lesions or exudates. Teeth in good repair. Neck: No deformities, masses, or tenderness noted. Full Range of motion. Thyroid soft with no nodularity. Lungs: Normal respiratory effort; chest expands symmetrically. Lungs are diminished to auscultation without rales, wheezes, or increased work of breathing. Heart: Normal rate and rhythm. Normal S1 and S2. No gallop, click, or rub. no murmur. Abdomen: Bowel sounds normal but hypoactive;  abdomen soft and nontender. No masses, organomegaly or hernias noted. Genitalia: as per Gyn . Musculoskeletal/extremities: No deformity  or scoliosis noted of  the thoracic or lumbar spine. No clubbing, cyanosis, edema,  or deformity noted. Range of motion  normal .Tone & strength  normal.Joints normal. Nail health  good. Vascular: Carotid, radial artery, dorsalis pedis and  posterior tibial pulses are full and equal. No bruits present. Neurologic: Alert and oriented x3. Deep tendon reflexes symmetrical and normal. Romberg negative as well as finger to nose negative.Gait normal. Skin: Intact without suspicious lesions or rashes. Lymph: No cervical, axillary, or inguinal lymphadenopathy present. Psych: Mood and affect are normal. Normally interactive        Assessment & Plan:  #1 comprehensive physical exam; no acute findings #2 see Problem List with Assessments & Recommendations  #3 dizziness, intermittent  #4 chest pain. EKG is normal except for a PAC is slightly decreased  volume in lead 3. Most likely etiology would be hiatal hernia with reflux. Plan: see Orders

## 2011-11-29 ENCOUNTER — Other Ambulatory Visit: Payer: Commercial Managed Care - PPO

## 2011-11-30 ENCOUNTER — Other Ambulatory Visit (INDEPENDENT_AMBULATORY_CARE_PROVIDER_SITE_OTHER): Payer: Commercial Managed Care - PPO

## 2011-11-30 DIAGNOSIS — E039 Hypothyroidism, unspecified: Secondary | ICD-10-CM

## 2011-11-30 NOTE — Progress Notes (Signed)
LABS ONLY  

## 2012-01-04 ENCOUNTER — Ambulatory Visit (INDEPENDENT_AMBULATORY_CARE_PROVIDER_SITE_OTHER): Payer: Commercial Managed Care - PPO | Admitting: Internal Medicine

## 2012-01-04 ENCOUNTER — Encounter: Payer: Self-pay | Admitting: Internal Medicine

## 2012-01-04 VITALS — BP 118/76 | HR 75 | Wt 242.8 lb

## 2012-01-04 DIAGNOSIS — G478 Other sleep disorders: Secondary | ICD-10-CM

## 2012-01-04 DIAGNOSIS — G479 Sleep disorder, unspecified: Secondary | ICD-10-CM

## 2012-01-04 DIAGNOSIS — R6889 Other general symptoms and signs: Secondary | ICD-10-CM

## 2012-01-04 DIAGNOSIS — R41840 Attention and concentration deficit: Secondary | ICD-10-CM

## 2012-01-04 DIAGNOSIS — E039 Hypothyroidism, unspecified: Secondary | ICD-10-CM
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
## 2012-01-04 NOTE — Patient Instructions (Addendum)
To prevent sleep dysfunction follow these instructions for sleep hygiene. Do not read, watch TV, or eat in bed. Do not get into bed until you are ready to turn off the light &  to go to sleep. Do not ingest stimulants ( decongestants, diet pills, nicotine, caffeine) after the evening meal.  Please try to go on My Chart within the next 24 hours to allow me to release the results directly to you.  

## 2012-01-25 ENCOUNTER — Institutional Professional Consult (permissible substitution): Payer: Commercial Managed Care - PPO | Admitting: Pulmonary Disease

## 2012-03-08 ENCOUNTER — Institutional Professional Consult (permissible substitution): Payer: Commercial Managed Care - PPO | Admitting: Pulmonary Disease

## 2012-03-31 ENCOUNTER — Encounter: Payer: Self-pay | Admitting: Pulmonary Disease

## 2012-03-31 ENCOUNTER — Ambulatory Visit (INDEPENDENT_AMBULATORY_CARE_PROVIDER_SITE_OTHER): Payer: Commercial Managed Care - PPO | Admitting: Pulmonary Disease

## 2012-03-31 VITALS — BP 118/70 | HR 84 | Temp 98.1°F | Ht 69.0 in | Wt 250.6 lb

## 2012-03-31 DIAGNOSIS — G479 Sleep disorder, unspecified: Secondary | ICD-10-CM

## 2012-03-31 NOTE — Progress Notes (Signed)
Subjective:    Patient ID: Gwendolyn [name], female    DOB: [birth year], 26 y.o.   MRN: [id number]  HPI The patient is a 26 year old female who I've been asked to see for possible obstructive sleep apnea or other sleep disorder.  She has had issues with sleep onset and maintenance for 2 years, and feels they have gotten much worse since adjustments to her thyroid medication.  The patient isn't sure if she snores, and no one has mentioned an abnormal breathing pattern during sleep.  She denies choking arousals, but has been told that she has very restless sleep.  It typically takes her an hour to fall asleep at night, and she states that her "mind is racing".  She has at least 2 awakenings at night, and is not rested in the mornings upon arising.  The patient does note sleep pressure during the day after lunch, and at times it interferes with her alertness and concentration.  She can also have sleepiness in the evenings with television and movies at times.  She denies any issues with driving.  She has had no one commented on taking during the night, and she denies RLS symptoms.  Of note, the patient's weight is up 15 pounds over the last 2 years, and her Epworth score today is 7.  Sleep Questionnaire: What time do you typically go to bed?( Between what hours) 10-10:30 pm How long does it take you to fall asleep? 45 minutes to 1 1/2 hours How many times during the night do you wake up? 2 What time do you get out of bed to start your day? 0600 Do you drive or operate heavy machinery in your occupation? No How much has your weight changed (up or down) over the past two years? (In pounds) 15 lb (6.804 kg) Have you ever had a sleep study before? No Do you currently use CPAP? No Do you wear oxygen at any time? No    Review of Systems  Constitutional: Negative for fever and unexpected weight change.  HENT: Negative for ear pain, nosebleeds, congestion, sore throat, rhinorrhea, sneezing, trouble swallowing, dental  problem, postnasal drip and sinus pressure.   Eyes: Negative for redness and itching.  Respiratory: Negative for cough, chest tightness, shortness of breath and wheezing.   Cardiovascular: Positive for chest pain. Negative for palpitations and leg swelling.  Gastrointestinal: Negative for nausea and vomiting.       Heartburn  Genitourinary: Negative for dysuria.  Musculoskeletal: Positive for arthralgias. Negative for joint swelling.  Skin: Negative for rash.  Neurological: Positive for headaches.  Hematological: Does not bruise/bleed easily.  Psychiatric/Behavioral: Negative for dysphoric mood. The patient is not nervous/anxious.   All other systems reviewed and are negative.       Objective:   Physical Exam Constitutional:  Obese female, no acute distress  HENT:  Nares patent without discharge  Oropharynx without exudate, palate and uvula are moderately elongated.   Eyes:  Perrla, eomi, no scleral icterus  Neck:  No JVD, no TMG  Cardiovascular:  Normal rate, regular rhythm, no rubs or gallops.  No murmurs        Intact distal pulses  Pulmonary :  Normal breath sounds, no stridor or respiratory distress   No rales, rhonchi, or wheezing  Abdominal:  Soft, nondistended, bowel sounds present.  No tenderness noted.   Musculoskeletal:  No lower extremity edema noted.  Lymph Nodes:  No cervical lymphadenopathy noted  Skin:  No cyanosis noted  Neurologic:  Alert, appropriate, moves all 4 extremities without obvious deficit.         Assessment & Plan:

## 2012-03-31 NOTE — Patient Instructions (Addendum)
Will schedule for sleep study, and arrange followup with me once results are available.  

## 2012-03-31 NOTE — Assessment & Plan Note (Signed)
The patient has significant issues with sleep onset and maintenance, and I suspect at least part of this is related to insomnia.  However, she is overweight and at significant risk for sleep apnea.  I have outlined 2 different paths, one of which is to work on sleep hygiene and behavioral therapy as outlined by Dr. Alwyn Ren and myself, and if no improvement, can then proceed with further sleep testing.  The other option is to go ahead and do a sleep study to rule out sleep apnea or other sleep disorders.  The patient is very frustrated with this, and would like to be as aggressive as possible with the workup.  I will therefore go ahead and schedule her for sleep testing for evaluation.

## 2012-04-19 ENCOUNTER — Encounter (HOSPITAL_BASED_OUTPATIENT_CLINIC_OR_DEPARTMENT_OTHER): Payer: Commercial Managed Care - PPO

## 2012-05-05 ENCOUNTER — Other Ambulatory Visit: Payer: Self-pay | Admitting: Internal Medicine

## 2012-05-05 NOTE — Telephone Encounter (Signed)
Rx sent.    MW 

## 2012-05-05 NOTE — Telephone Encounter (Signed)
Rx need okay.     MW

## 2012-05-05 NOTE — Telephone Encounter (Signed)
#  90,R X 1 

## 2012-05-09 ENCOUNTER — Encounter (HOSPITAL_BASED_OUTPATIENT_CLINIC_OR_DEPARTMENT_OTHER): Payer: Commercial Managed Care - PPO

## 2012-05-10 ENCOUNTER — Other Ambulatory Visit: Payer: Self-pay | Admitting: Internal Medicine

## 2012-05-10 NOTE — Telephone Encounter (Signed)
TSH 244.9 

## 2012-05-30 ENCOUNTER — Encounter (HOSPITAL_BASED_OUTPATIENT_CLINIC_OR_DEPARTMENT_OTHER): Payer: Commercial Managed Care - PPO

## 2012-11-02 ENCOUNTER — Encounter: Payer: Self-pay | Admitting: Certified Nurse Midwife

## 2012-11-02 ENCOUNTER — Ambulatory Visit (INDEPENDENT_AMBULATORY_CARE_PROVIDER_SITE_OTHER): Payer: Commercial Managed Care - PPO | Admitting: Certified Nurse Midwife

## 2012-11-02 ENCOUNTER — Encounter: Payer: Self-pay | Admitting: Internal Medicine

## 2012-11-02 VITALS — BP 118/80 | Ht 68.0 in | Wt 257.0 lb

## 2012-11-02 DIAGNOSIS — B372 Candidiasis of skin and nail: Secondary | ICD-10-CM | POA: Insufficient documentation

## 2012-11-02 DIAGNOSIS — IMO0001 Reserved for inherently not codable concepts without codable children: Secondary | ICD-10-CM

## 2012-11-02 DIAGNOSIS — Z01419 Encounter for gynecological examination (general) (routine) without abnormal findings: Secondary | ICD-10-CM

## 2012-11-02 DIAGNOSIS — Z309 Encounter for contraceptive management, unspecified: Secondary | ICD-10-CM

## 2012-11-02 DIAGNOSIS — E669 Obesity, unspecified: Secondary | ICD-10-CM

## 2012-11-02 LAB — CBC
Hemoglobin: 12.2 g/dL (ref 12.0–15.0)
Platelets: 394 10*3/uL (ref 150–400)

## 2012-11-02 MED ORDER — NYSTATIN-TRIAMCINOLONE 100000-0.1 UNIT/GM-% EX OINT: TOPICAL_OINTMENT | Freq: Two times a day (BID) | CUTANEOUS | Status: DC

## 2012-11-02 MED ORDER — NORETHIN ACE-ETH ESTRAD-FE 1-20 MG-MCG(24) PO TABS: 1.0000 | ORAL_TABLET | Freq: Every day | ORAL | Status: DC

## 2012-11-02 NOTE — Progress Notes (Signed)
27 y.o. SingleCaucasian female  Obese.  [id number] here for annual exam. Periods are normal.  Contraception condoms and foams but desires OCP again. Sexually active.  No STD concerns or testing desired.  No follow up on Hypothyroid in greater than a year.  Complaining of fatigue and indigestion with certain foods. No labs in greater than a year.  Request today if possible. Complaining of skin irritation under abdominal skin fold, uses powder to help with.   No itching or burning or new personal products. Working on portion control with foods.  Had Lipid panel in the past year normal per patient  Patient's last menstrual period was 10/20/2012.          Sexually active: yes  The current method of family planning is condoms all the time.    Exercising: yes  walking & running Last mammogram: none Last pap: 11/02/11 Last BMD: none Alcohol: twice a year Tobacco: none   Health Maintenance  Topic Date Due  . Pap Smear  01/28/2006  . Influenza Vaccine  04/09/2010  . Tetanus/tdap  08/09/2016    Family History  Problem Relation Age of Onset  . Depression Mother     ? onset in mid 22s  . Anxiety disorder Mother   . Asthma Father   . Diabetes Father   . Thyroid disease Father     S/P thyroidectomy  . Heart disease Father     S/P defibrillator  . Cancer Maternal Grandmother     ??  . Cancer Maternal Grandfather     lung, former smoker    Patient Active Problem List  Diagnosis  . HYPOTHYROIDISM  . ANEMIA-IRON DEFICIENCY  . ALLERGIC RHINITIS  . SLEEP DISORDER  . HYPERLIPIDEMIA    Past Medical History  Diagnosis Date  . Allergy     perennial  . Anemia     PMH of  . Thyroid disease     hypothyroidism  . Hyperlipidemia 07/20/2010    LDL 150, HDL 37.9  . Nonspecific elevation of levels of transaminase or lactic acid dehydrogenase (LDH) 07/20/2010    PMH of; ALT 47   . Abnormal pap 04/2010    Hx abn.pap/colpo--bx neg    Past Surgical History  Procedure Laterality Date  .  Wisdom tooth extraction    . Knee arthroscopy  2012    L; Dr Shelle Iron    Allergies: Review of patient's allergies indicates no known allergies.  Current Outpatient Prescriptions  Medication Sig Dispense Refill  . cetirizine (ZYRTEC) 10 MG tablet Take 10 mg by mouth daily. Seasonally March-Aug      . levothyroxine (SYNTHROID, LEVOTHROID) 100 MCG tablet 1 by mouth daily EXCEPT T/TH 1 1/2      . Multiple Vitamin (MULTIVITAMIN) tablet Take 1 tablet by mouth daily.       No current facility-administered medications for this visit.    ROS: A comprehensive review of systems was negative.  Exam:    BP 118/80  Ht 5\' 8"  (1.727 m)  Wt 257 lb (116.574 kg)  BMI 39.09 kg/m2  LMP 10/20/2012 Weight change: @WEIGHTCHANGE @ Last 3 height recordings:  Ht Readings from Last 3 Encounters:  11/02/12 5\' 8"  (1.727 m)  03/31/12 5\' 9"  (1.753 m)  09/16/11 5' 8.5" (1.74 m)   General appearance: alert and cooperative Head: Normocephalic, without obvious abnormality, atraumatic Neck: no adenopathy, supple, symmetrical, trachea midline and thyroid not enlarged, symmetric, no tenderness/mass/nodules Lungs: clear to auscultation bilaterally Breasts: normal appearance, no masses or tenderness, Inspection negative,  No nipple retraction or dimpling Heart: regular rate and rhythm Abdomen: soft, non-tender; bowel sounds normal; no masses,  no organomegaly Extremities: extremities normal, atraumatic, no cyanosis or edema Skin: Normal appearance except for red rash under abdominal skin fold Lymph nodes: Cervical, supraclavicular, and axillary nodes normal. no inguinal nodes palpated Neurologic: Alert and oriented X 3, normal strength and tone. Normal symmetric reflexes. Normal coordination and gait   Pelvic: External genitalia:  no lesions              Urethra:  normal appearing urethra with no masses, tenderness or lesions              Bartholins and Skenes: normal                 Vagina: normal appearing vagina  with normal color and discharge, no lesions              Cervix: normal appearance              Pap taken: yes        Bimanual Exam:  Uterus:  uterus is normal size, shape, consistency and nontender                                      Adnexa:    not indicated and normal adnexa in size, nontender and no masses                                      Rectovaginal: Confirms                                      Anus:  defer exam KOH skin wet prep- + yeast buds  A:Well woman 2-Contraception OCP desired 3- Yeast Dermatitis 4-Hypothyroid no follow up      P:  1-Health and wellness reviewed pertinent to exam.  Work on weight control and exercise Pap smear annually if indicated 2-Rx Lomedia 24 Fe x 1 year 3- Reviewed findings.  Rx Nystatin/.01% Triamcinolone ointment no refills with instructions and warning signs and symptoms Dry affected area well after bathing. 4-TSH  Stressed importance of monitoring for well being with PCP 5- Labs CBC, Vitamin D  return annually or prn      An After Visit Summary was printed and given to the patient.  Reviewed, TL

## 2012-11-03 ENCOUNTER — Telehealth: Payer: Self-pay | Admitting: *Deleted

## 2012-11-03 ENCOUNTER — Other Ambulatory Visit: Payer: Self-pay

## 2012-11-03 DIAGNOSIS — E550 Rickets, active: Secondary | ICD-10-CM

## 2012-11-03 DIAGNOSIS — E559 Vitamin D deficiency, unspecified: Secondary | ICD-10-CM

## 2012-11-03 MED ORDER — ERGOCALCIFEROL 1.25 MG (50000 UT) PO CAPS: 50000.0000 [IU] | ORAL_CAPSULE | ORAL | Status: DC

## 2012-11-03 NOTE — Telephone Encounter (Signed)
Dr. Melvyn Neth , Pathologist, called stat pap, ASCUS, HPV pending and will be sent on Monday , report. sue

## 2012-11-06 LAB — IPS PAP SMEAR ONLY

## 2012-11-08 ENCOUNTER — Encounter: Payer: Self-pay | Admitting: Internal Medicine

## 2012-11-14 ENCOUNTER — Telehealth: Payer: Self-pay | Admitting: Certified Nurse Midwife

## 2012-11-14 NOTE — Telephone Encounter (Signed)
Pt is returning call to Joy.

## 2012-11-14 NOTE — Telephone Encounter (Signed)
Pt notified of pap results. See scanned pap

## 2012-11-28 ENCOUNTER — Encounter: Payer: Self-pay | Admitting: Certified Nurse Midwife

## 2012-12-19 ENCOUNTER — Ambulatory Visit (INDEPENDENT_AMBULATORY_CARE_PROVIDER_SITE_OTHER): Payer: Commercial Managed Care - PPO | Admitting: Internal Medicine

## 2012-12-19 ENCOUNTER — Encounter: Payer: Self-pay | Admitting: Internal Medicine

## 2012-12-19 VITALS — BP 120/84 | HR 82 | Temp 98.2°F | Resp 14 | Ht 68.0 in | Wt 255.0 lb

## 2012-12-19 DIAGNOSIS — E559 Vitamin D deficiency, unspecified: Secondary | ICD-10-CM

## 2012-12-19 DIAGNOSIS — J302 Other seasonal allergic rhinitis: Secondary | ICD-10-CM

## 2012-12-19 DIAGNOSIS — J309 Allergic rhinitis, unspecified: Secondary | ICD-10-CM

## 2012-12-19 MED ORDER — LEVOTHYROXINE SODIUM 100 MCG PO TABS: 100.0000 ug | ORAL_TABLET | Freq: Every day | ORAL | Status: DC

## 2012-12-19 NOTE — Patient Instructions (Addendum)
Preventive Health Care: Exercise  30-45  minutes a day, 3-4 days a week. Walking is especially valuable in preventing Osteoporosis. Eat a low-fat diet with lots of fruits and vegetables, up to 7-9 servings per day. This would eliminate need for vitamin supplements for most individuals. Avoid obesity; your goal = waist less than 35 inches.Consume less than 30 grams of sugar per day from foods & drinks with High Fructose Corn Syrup as #2,3 or #4 on label.  When the vitamin D level is rechecked in July; please recheck TSH.PLEASE BRING THESE INSTRUCTIONS TO FOLLOW UP  LAB APPOINTMENT.This will guarantee correct labs are drawn, eliminating need for repeat blood sampling ( needle sticks ! ). Diagnoses Gertie Fey: 478.2,956.9.

## 2012-12-19 NOTE — Progress Notes (Signed)
  Subjective:    Patient ID: Gwendolyn [name], female    DOB: [birth year], 27 y.o.   MRN: [id number]  HPI Gwendolyn [name] is here for a physical;she denies acute issues.     Review of Systems She is on a modified heart healthy diet; she exercises as walking 45-60 minutes daily weather permitting without symptoms. Specifically she denies chest pain,  dyspnea, or claudication. Family history is negative for premature coronary disease . She did have some palpitations with the recent increase in her thyroid dose.    Objective:   Physical Exam Gen.: Healthy and well-nourished in appearance. Alert, appropriate and cooperative throughout exam. Head: Normocephalic without obvious abnormalities  Eyes: No corneal or conjunctival inflammation noted.  Extraocular motion intact. Vision grossly normal without lenses Ears: External  ear exam reveals no significant lesions or deformities. Canals clear .TMs normal. Hearing is grossly normal bilaterally. Nose: External nasal exam reveals no deformity or inflammation. Nasal mucosa are pink and moist. No lesions or exudates noted.   Mouth: Oral mucosa and oropharynx reveal no lesions or exudates. Teeth in good repair. Neck: No deformities, masses, or tenderness noted. Range of motion & Thyroid  normal. Lungs: Normal respiratory effort; chest expands symmetrically. Lungs are clear to auscultation without rales, wheezes, or increased work of breathing. Heart: Normal rate and rhythm. Normal S1 and S2. No gallop, click, or rub. S4 w/o murmur. Abdomen: Bowel sounds normal; abdomen soft and nontender. No masses, organomegaly or hernias noted. Genitalia: As per Gyn                                  Musculoskeletal/extremities: No deformity or scoliosis noted of  the thoracic or lumbar spine.  No clubbing, cyanosis, edema, or significant extremity  deformity noted. Range of motion normal .Tone & strength  Normal. Joints reveal minor flexion  DIP changes. Nail health good. Able to lie  down & sit up w/o help. Negative SLR bilaterally Vascular: Carotid, radial artery, dorsalis pedis and  posterior tibial pulses are full and equal. No bruits present. Neurologic: Alert and oriented x3. Deep tendon reflexes symmetrical and normal.         Skin: Intact without suspicious lesions or rashes. Lymph: No cervical, axillary lymphadenopathy present. Psych: Mood and affect are normal. Normally interactive                                                                                        Assessment & Plan:  #1 comprehensive physical exam; no acute findings  Plan: see Orders  & Recommendations

## 2013-03-05 ENCOUNTER — Other Ambulatory Visit: Payer: Self-pay | Admitting: Gynecology

## 2013-03-05 ENCOUNTER — Encounter: Payer: Self-pay | Admitting: Internal Medicine

## 2013-03-05 ENCOUNTER — Other Ambulatory Visit (INDEPENDENT_AMBULATORY_CARE_PROVIDER_SITE_OTHER): Payer: 59

## 2013-03-05 DIAGNOSIS — E559 Vitamin D deficiency, unspecified: Secondary | ICD-10-CM

## 2013-03-05 DIAGNOSIS — E039 Hypothyroidism, unspecified: Secondary | ICD-10-CM

## 2013-03-06 ENCOUNTER — Encounter: Payer: Self-pay | Admitting: Internal Medicine

## 2013-03-13 ENCOUNTER — Ambulatory Visit (INDEPENDENT_AMBULATORY_CARE_PROVIDER_SITE_OTHER): Payer: 59 | Admitting: Internal Medicine

## 2013-03-13 ENCOUNTER — Encounter: Payer: Self-pay | Admitting: Internal Medicine

## 2013-03-13 VITALS — BP 126/84 | HR 90 | Wt 254.0 lb

## 2013-03-13 DIAGNOSIS — G43109 Migraine with aura, not intractable, without status migrainosus: Secondary | ICD-10-CM

## 2013-03-13 DIAGNOSIS — E559 Vitamin D deficiency, unspecified: Secondary | ICD-10-CM

## 2013-03-13 DIAGNOSIS — E039 Hypothyroidism, unspecified: Secondary | ICD-10-CM

## 2013-03-13 HISTORY — DX: Migraine with aura, not intractable, without status migrainosus: G43.109

## 2013-03-13 MED ORDER — LEVOTHYROXINE SODIUM 150 MCG PO TABS: ORAL_TABLET | ORAL | Status: DC

## 2013-03-13 MED ORDER — SUMATRIPTAN SUCCINATE 50 MG PO TABS: ORAL_TABLET | ORAL | Status: DC

## 2013-03-13 NOTE — Assessment & Plan Note (Signed)
Excedrin Migraine can be taking with the flushing to prevent the migraines; Imitrex recommended for the classic migraine headache.  She continued to keep her diary to assess the process and response to therapy

## 2013-03-13 NOTE — Progress Notes (Signed)
Subjective:    Patient ID: Gwendolyn [name], female    DOB: 1986/07/08, 27 y.o.   MRN: [id number]  HPI Despite an increase in her thyroid dose in March of this year; her TSH has climbed from 6.835-11.634. The brand has not changed nor has administration mode; she's missed no doses.  Her vitamin D responded significantly to 50,000 international units weekly x8; vitamin D level rose from 18-33. She is now on thousand international units daily.  She describes recurrent headaches over the last 6 months worse since May. She had 3 episodes last week. These are described as throbbing above the left eye with radiation to the left occiput. These are associated with light sensitivity and blurred vision.. These can last hours to "all day". There also associated with some sensation of dizziness and being flushed as well as nausea. Flushing appears to be a prodrome. She also describes sensitivity to sound  These typically are4  on a 10 scale initially but can  Increase in intensity to 7-8. Excedrin Migraine was of some benefit; she's taken this several times a week but not daily.  She questioned whether barometric pressure may be playing some role. She also questions sinus problems but denies nasal purulence, dental pain, sore throat fever, chills, or sweats.  Her mother has a history of migraines.   Review of Systems  Constitutional: No significant change in weight. Significant fatigue;no sleep disorder; no change in appetite. Eye: no  double ,loss of vision.  Cardiovascular: no palpitations; racing; irregularity ENT/GI: no constipation; diarrhea;hoarseness;dysphagia Derm: no change in nails,hair,skin Neuro: no numbness or tingling; tremor Psych:no anxiety; depression Endo: some temperature "sensitivity" to cold      Objective:   Physical Exam Gen.:  well-nourished in appearance. Alert, appropriate and cooperative throughout exam. Head: Normocephalic without obvious abnormalities Eyes: No corneal or  conjunctival inflammation noted. Pupils equal round reactive to light and accommodation. FOV WNL. Extraocular motion intact. Vision grossly normal with lenses Ears: External  ear exam reveals no significant lesions or deformities. Canals clear .TMs normal. Hearing is grossly normal bilaterally. Nose: External nasal exam reveals no deformity or inflammation. Nasal mucosa are pink and moist. No lesions or exudates noted.  Mouth: Oral mucosa and oropharynx reveal no lesions or exudates. Teeth in good repair. Neck: No deformities, masses, or tenderness noted. Range of motion normal. Thyroid goiter. Lungs: Normal respiratory effort; chest expands symmetrically. Lungs are clear to auscultation without rales, wheezes, or increased work of breathing. Heart: Normal rate and rhythm. Normal S1 and S2. No gallop, click, or rub. No murmur.                               Musculoskeletal/extremities: No clubbing, cyanosis, edema, or significant extremity  deformity noted. Range of motion normal .Tone & strength  Normal. Joints normal . Nail health good.  Vascular: Carotid &  radial artery pulses are full and equal. No bruits present. Neurologic: Alert and oriented x3. Deep tendon reflexes symmetrical and normal. No cranial nerve deficit Gait normal . Romberg and finger to nose testing normal .        Skin: Intact without suspicious lesions or rashes. Lymph: No cervical, axillary lymphadenopathy present. Psych: Mood and affect are normal. Normally interactive  Assessment & Plan:  #1 headaches, classic migraine suggested. No neurologic deficit  #2 see problem list update with recommendations

## 2013-03-13 NOTE — Assessment & Plan Note (Signed)
Thyroid supplementation will be increased to 150 mcg daily with repeat TSH in 8-10 weeks.

## 2013-03-13 NOTE — Assessment & Plan Note (Signed)
As per gynecology 

## 2013-03-13 NOTE — Patient Instructions (Addendum)
TSH in 8-10 weeks; Code 244.9.

## 2013-04-19 ENCOUNTER — Encounter: Payer: Self-pay | Admitting: Certified Nurse Midwife

## 2013-04-20 ENCOUNTER — Encounter: Payer: Self-pay | Admitting: Certified Nurse Midwife

## 2013-05-16 ENCOUNTER — Other Ambulatory Visit: Payer: 59

## 2013-05-17 ENCOUNTER — Other Ambulatory Visit (INDEPENDENT_AMBULATORY_CARE_PROVIDER_SITE_OTHER): Payer: 59

## 2013-05-17 DIAGNOSIS — E039 Hypothyroidism, unspecified: Secondary | ICD-10-CM

## 2013-05-17 LAB — TSH: TSH: 9.65 u[IU]/mL — ABNORMAL HIGH (ref 0.35–5.50)

## 2013-06-14 ENCOUNTER — Other Ambulatory Visit: Payer: Self-pay

## 2013-06-29 ENCOUNTER — Encounter: Payer: Self-pay | Admitting: Certified Nurse Midwife

## 2013-09-01 ENCOUNTER — Ambulatory Visit (INDEPENDENT_AMBULATORY_CARE_PROVIDER_SITE_OTHER): Payer: 59 | Admitting: Physician Assistant

## 2013-09-01 ENCOUNTER — Other Ambulatory Visit: Payer: Self-pay | Admitting: Physician Assistant

## 2013-09-01 VITALS — BP 140/90 | HR 98 | Temp 97.8°F | Resp 16

## 2013-09-01 DIAGNOSIS — R42 Dizziness and giddiness: Secondary | ICD-10-CM

## 2013-09-01 DIAGNOSIS — E039 Hypothyroidism, unspecified: Secondary | ICD-10-CM

## 2013-09-01 DIAGNOSIS — R110 Nausea: Secondary | ICD-10-CM

## 2013-09-01 DIAGNOSIS — G43909 Migraine, unspecified, not intractable, without status migrainosus: Secondary | ICD-10-CM

## 2013-09-01 LAB — POCT CBC
Granulocyte percent: 67.5 %G (ref 37–80)
HCT, POC: 43.5 % (ref 37.7–47.9)
Hemoglobin: 13.5 g/dL (ref 12.2–16.2)
Lymph, poc: 2.4 (ref 0.6–3.4)
MCH, POC: 27.7 pg (ref 27–31.2)
MCHC: 31 g/dL — AB (ref 31.8–35.4)
MCV: 89.2 fL (ref 80–97)
MID (cbc): 0.4 (ref 0–0.9)
MPV: 9.2 fL (ref 0–99.8)
POC Granulocyte: 5.9 (ref 2–6.9)
POC LYMPH PERCENT: 27.4 %L (ref 10–50)
POC MID %: 5.1 %M (ref 0–12)
Platelet Count, POC: 375 10*3/uL (ref 142–424)
RBC: 4.88 M/uL (ref 4.04–5.48)
RDW, POC: 13.3 %
WBC: 8.7 10*3/uL (ref 4.6–10.2)

## 2013-09-01 LAB — TSH: TSH: 6.635 u[IU]/mL — ABNORMAL HIGH (ref 0.350–4.500)

## 2013-09-01 LAB — T3, FREE: T3, Free: 3.3 pg/mL (ref 2.3–4.2)

## 2013-09-01 LAB — T4, FREE: Free T4: 1.31 ng/dL (ref 0.80–1.80)

## 2013-09-01 MED ORDER — PROMETHAZINE HCL 25 MG/ML IJ SOLN
25.0000 mg | Freq: Once | INTRAMUSCULAR | Status: AC
  Administered 2013-09-01: 25 mg via INTRAMUSCULAR

## 2013-09-01 MED ORDER — KETOROLAC TROMETHAMINE 60 MG/2ML IM SOLN
60.0000 mg | Freq: Once | INTRAMUSCULAR | Status: AC
Start: 2013-09-01 — End: 2013-09-01
  Administered 2013-09-01: 60 mg via INTRAMUSCULAR

## 2013-09-01 NOTE — Progress Notes (Signed)
Subjective:    Patient ID: Gwendolyn [name], female    DOB: 04-17-1986, 28 y.o.   MRN: [id number]  HPI 28 year old female presents for evaluation of early migraine headache. Has hx of migraine's for "years" but admits they seem to be increasing in frequency over the past month. Complains of almost weekly headache, and biweekly migraine.  Has imitrex at home that usually works well, but recently she thinks it has not been working as well.    Symptoms started today with dizziness, nausea, and slowly worsening headache.  No emesis.  Admits to blurred vision and photophobia which is typical for her migraines. Last migraine she had was 1 week ago treated successfully with imitrex.  Denies fever, chills, abdominal pain, nasal congestion, or neck pain.    Hx of hypothyroidism treated with synthroid. Has been managed by PCP but has had a 5 year history of fluctuating TSH levels that have been difficult to manage. Has had dosage changes but always seems to be abnormal when checked. Family hx significant for father who had thyroidectomy and maternal grandmother with hypothyroidism. Patient does wonder if fluctuating TSH levels contribute to migraine headaches and fatigue.      Review of Systems  Constitutional: Positive for fatigue. Negative for fever and chills.  HENT: Negative for congestion.   Eyes: Positive for photophobia and visual disturbance (blurry). Negative for pain and discharge.  Cardiovascular: Negative for chest pain.  Gastrointestinal: Positive for nausea. Negative for vomiting.  Neurological: Positive for dizziness and headaches.       Objective:   Physical Exam  Constitutional: She is oriented to person, place, and time. She appears well-developed and well-nourished.  HENT:  Head: Normocephalic and atraumatic.  Right Ear: Hearing, tympanic membrane, external ear and ear canal normal.  Left Ear: Hearing, tympanic membrane, external ear and ear canal normal.  Mouth/Throat: Uvula is  midline, oropharynx is clear and moist and mucous membranes are normal. No oropharyngeal exudate.  Eyes: Conjunctivae and EOM are normal. Pupils are equal, round, and reactive to light.  Neck: Normal range of motion.  Cardiovascular: Normal rate, regular rhythm and normal heart sounds.   Pulmonary/Chest: Effort normal and breath sounds normal.  Neurological: She is alert and oriented to person, place, and time.  Psychiatric: She has a normal mood and affect. Her behavior is normal. Judgment and thought content normal.    Results for orders placed in visit on 09/01/13  POCT CBC      Result Value Range   WBC 8.7  4.6 - 10.2 K/uL   Lymph, poc 2.4  0.6 - 3.4   POC LYMPH PERCENT 27.4  10 - 50 %L   MID (cbc) 0.4  0 - 0.9   POC MID % 5.1  0 - 12 %M   POC Granulocyte 5.9  2 - 6.9   Granulocyte percent 67.5  37 - 80 %G   RBC 4.88  4.04 - 5.48 M/uL   Hemoglobin 13.5  12.2 - 16.2 g/dL   HCT, POC 10.243.5  72.537.7 - 47.9 %   MCV 89.2  80 - 97 fL   MCH, POC 27.7  27 - 31.2 pg   MCHC 31.0 (*) 31.8 - 35.4 g/dL   RDW, POC 36.613.3     Platelet Count, POC 375  142 - 424 K/uL   MPV 9.2  0 - 99.8 fL         Assessment & Plan:  Dizziness and giddiness - Plan: POCT CBC,  TSH  Nausea alone - Plan: promethazine (PHENERGAN) injection 25 mg  Unspecified hypothyroidism - Plan: TSH, Ambulatory referral to Endocrinology  Migraine headache - Plan: ketorolac (TORADOL) injection 60 mg  CBC normal. Symptoms suggestive of migraine headache.  Toradol 60 mg IM and phenergan 25 mg IM today To be out of work for the remainder of today and possibly tomorrow.  Push fluids. TSH, free T3, and free T4 pending. Will go ahead and refer to Endocrinology for further evaluation and treatment of hypothyroidism. Recheck tomorrow if symptoms fail to improve. To ER if acutely worse.

## 2013-09-10 ENCOUNTER — Ambulatory Visit: Payer: 59 | Admitting: Endocrinology

## 2013-09-17 ENCOUNTER — Ambulatory Visit (INDEPENDENT_AMBULATORY_CARE_PROVIDER_SITE_OTHER): Payer: 59 | Admitting: Endocrinology

## 2013-09-17 ENCOUNTER — Encounter: Payer: Self-pay | Admitting: Endocrinology

## 2013-09-17 VITALS — BP 116/76 | HR 77 | Temp 98.2°F | Ht 68.0 in | Wt 253.0 lb

## 2013-09-17 DIAGNOSIS — E039 Hypothyroidism, unspecified: Secondary | ICD-10-CM

## 2013-09-17 DIAGNOSIS — R110 Nausea: Secondary | ICD-10-CM

## 2013-09-17 NOTE — Patient Instructions (Signed)
Please increase the synthroid to 175 mcg/day.  Here are some samples. Please redo the blood test in 1 month.

## 2013-09-17 NOTE — Progress Notes (Signed)
Subjective:    Patient ID: Gwendolyn [name], female    DOB: [birth year], 28 y.o.   MRN: [id number]  HPI Pt reports hypothyroidism was dx'ed in 2010.  she has never been on synthroid since then.  The dosage has varied from 88 to 150 mcg/day, but she has been on 150/d, since 2013.  she has never taken non-prescribed thyroid hormone therapy.  He has never taken kelp or any other type of non-prescribed thyroid product.  He has never had thyroid imaging.  She is not considering a pregnancy.  He has never had thyroid surgery, or XRT to the neck.  He has never been on amiodarone or lithium.  She has moderate palpitations in the chest, and assoc cold intolerance.   Past Medical History  Diagnosis Date  . Seasonal allergies     rhinitis  . Anemia     PMH of  . Thyroid disease     hypothyroidism  . Hyperlipidemia 07/20/2010    LDL 150, HDL 37.9  . Nonspecific elevation of levels of transaminase or lactic acid dehydrogenase (LDH) 07/20/2010    PMH of; ALT 47   . Abnormal pap 04/2010    Hx abn.pap/colpo--bx neg    Past Surgical History  Procedure Laterality Date  . Wisdom tooth extraction    . Knee arthroscopy  2012    L; Dr Shelle Iron    History   Social History  . Marital Status: Single    Spouse Name: N/A    Number of Children: N/A  . Years of Education: N/A   Occupational History  . Not on file.   Social History Main Topics  . Smoking status: Never Smoker   . Smokeless tobacco: Not on file  . Alcohol Use: No  . Drug Use: No  . Sexual Activity: Yes    Partners: Male    Birth Control/ Protection: Condom   Other Topics Concern  . Not on file   Social History Narrative  . No narrative on file    Current Outpatient Prescriptions on File Prior to Visit  Medication Sig Dispense Refill  . cetirizine (ZYRTEC) 10 MG tablet Take 10 mg by mouth daily. Seasonally March-Aug      . cholecalciferol (VITAMIN D) 1000 UNITS tablet Take 1,000 Units by mouth daily.      Marland Kitchen levothyroxine  (SYNTHROID, LEVOTHROID) 150 MCG tablet 1 by mouth daily  90 tablet  1  . Multiple Vitamin (MULTIVITAMIN) tablet Take 1 tablet by mouth daily.      . Norethindrone Acetate-Ethinyl Estrad-FE (LOMEDIA 24 FE) 1-20 MG-MCG(24) tablet Take 1 tablet by mouth daily.  1 Package  11  . Probiotic Product (PROBIOTIC DAILY PO) Take by mouth. 1 week on, 1 week off per GYN      . SUMAtriptan (IMITREX) 50 MG tablet 1 pill with onset of headache; repeat in 2 hrs prn  10 tablet  0   No current facility-administered medications on file prior to visit.   No Known Allergies  Family History  Problem Relation Age of Onset  . Depression Mother     ? onset in mid 44s  . Anxiety disorder Mother   . Asthma Father   . Diabetes Father   . Thyroid nodules Father     S/P thyroidectomy  . Heart disease Father     S/P defibrillator  . Cancer Maternal Grandmother     ??  . Lung cancer Maternal Grandfather     lung, former smoker  father  had thyroidectomy, for uncertain reason.  BP 116/76  Pulse 77  Temp(Src) 98.2 F (36.8 C) (Oral)  Ht 5\' 8"  (1.727 m)  Wt 253 lb (114.76 kg)  BMI 38.48 kg/m2  SpO2 97%  LMP 08/31/2013  Review of Systems She has alternating insomnia and hypersomnia.  She has muscle cramps, rhinorrhea, dry skin, weight gain, hair loss, and depression.  She has intermittent headache.  denies sob, memory loss, constipation, numbness, blurry vision, myalgias, easy bruising, and syncope.  She has postprandial nausea or bloating.      Objective:   Physical Exam VS: see vs page GEN: no distress HEAD: head: no deformity eyes: no periorbital swelling, no proptosis external nose and ears are normal mouth: no lesion seen NECK: supple, thyroid is not enlarged CHEST WALL: no deformity LUNGS:  Clear to auscultation CV: reg rate and rhythm, no murmur ABD: abdomen is soft, nontender.  no hepatosplenomegaly.  not distended.  no hernia MUSCULOSKELETAL: muscle bulk and strength are grossly normal.  no  obvious joint swelling.  gait is normal and steady.   EXTEMITIES: no deformity. no edema.   PULSES: no carotid bruit.    NEURO:  cn 2-12 grossly intact.   readily moves all 4's.  sensation is intact to touch on all 4's.  No tremor.   SKIN:  Normal texture and temperature.  No rash or suspicious lesion is visible.  NODES:  None palpable at the neck.   PSYCH: alert, well-oriented.  Does not appear anxious nor depressed.    Lab Results  Component Value Date   TSH 6.635* 09/01/2013      Assessment & Plan:  Chronic hypothyroidism: she needs slightly increased rx. GI sxs: not typical of celiac dz, but she is at increased rx, in view of her thyroid dz. Cold intolerance: unlikely thyroid-related.

## 2013-10-15 ENCOUNTER — Other Ambulatory Visit (INDEPENDENT_AMBULATORY_CARE_PROVIDER_SITE_OTHER): Payer: 59

## 2013-10-15 ENCOUNTER — Encounter: Payer: Self-pay | Admitting: Endocrinology

## 2013-10-15 DIAGNOSIS — E039 Hypothyroidism, unspecified: Secondary | ICD-10-CM

## 2013-10-15 LAB — TSH: TSH: 5.42 u[IU]/mL (ref 0.35–5.50)

## 2013-10-16 ENCOUNTER — Other Ambulatory Visit: Payer: Self-pay | Admitting: Endocrinology

## 2013-10-16 MED ORDER — LEVOTHYROXINE SODIUM 175 MCG PO TABS: 175.0000 ug | ORAL_TABLET | Freq: Every day | ORAL | Status: DC

## 2013-10-28 ENCOUNTER — Ambulatory Visit (INDEPENDENT_AMBULATORY_CARE_PROVIDER_SITE_OTHER): Payer: 59 | Admitting: Physician Assistant

## 2013-10-28 VITALS — BP 136/100 | HR 85 | Temp 98.0°F | Resp 16 | Ht 67.75 in | Wt 250.0 lb

## 2013-10-28 DIAGNOSIS — G43909 Migraine, unspecified, not intractable, without status migrainosus: Secondary | ICD-10-CM

## 2013-10-28 MED ORDER — PROMETHAZINE HCL 25 MG PO TABS: 25.0000 mg | ORAL_TABLET | Freq: Three times a day (TID) | ORAL | Status: DC | PRN

## 2013-10-28 MED ORDER — KETOROLAC TROMETHAMINE 60 MG/2ML IM SOLN
60.0000 mg | Freq: Once | INTRAMUSCULAR | Status: AC
Start: 2013-10-28 — End: 2013-10-28
  Administered 2013-10-28: 60 mg via INTRAMUSCULAR

## 2013-10-28 MED ORDER — RIZATRIPTAN BENZOATE 5 MG PO TABS: 5.0000 mg | ORAL_TABLET | ORAL | Status: DC | PRN

## 2013-10-28 NOTE — Progress Notes (Signed)
   Subjective:    Patient ID: Gwendolyn [name], female    DOB: Nov 09, 1985, 28 y.o.   MRN: [id number]  HPI   Ms. Gwendolyn [name] is a very pleasant 28 yr old female here complaining of a migraine.  Migraine started last night at around 7 pm, refractory to Imitrex.  Pt was diagnosed with migraines only about a year ago - imitrex is the only abortive medication she's ever been on.  She has also been taking ibuprofen - last dose about 2 hours ago (400mg ).  Was seen here for migraine in January 2015 - thought perhaps migraines were exacerbated by thyroid abnormality.  She has since seen endo and had meds adjusted and does note fewer migraines.  Previously was having 1-2/wk, now is having 1 every couple of weeks.  Last about 3 wks ago.  Last endo appt early March, TSH WNL  Current headache is left sided.  Always starts behind left eye and kind of spreads from there.  Associated nausea, no vomiting.  No photophobia but "my vision gets a little screwy."  No aura.  Her current symptoms are consistent with her typical migraine.  Typically sleep helps resolve symptoms.  She has otherwise been feeling well and in her usual state of health  Review of Systems  Constitutional: Negative for fever and chills.  Respiratory: Negative.   Cardiovascular: Negative.   Gastrointestinal: Positive for nausea. Negative for vomiting.  Musculoskeletal: Negative.   Skin: Negative.   Neurological: Positive for dizziness and headaches.       Objective:   Physical Exam  Vitals reviewed. Constitutional: She is oriented to person, place, and time. She appears well-developed and well-nourished. No distress.  HENT:  Head: Normocephalic and atraumatic.  Eyes: Conjunctivae and EOM are normal. Pupils are equal, round, and reactive to light. No scleral icterus.  Neck: Neck supple.  Cardiovascular: Normal rate, regular rhythm and normal heart sounds.   Pulmonary/Chest: Effort normal and breath sounds normal. She has no wheezes. She has no  rales.  Lymphadenopathy:    She has no cervical adenopathy.  Neurological: She is alert and oriented to person, place, and time. No cranial nerve deficit.  Skin: Skin is warm and dry.  Psychiatric: She has a normal mood and affect. Her behavior is normal.   Recheck BP:  122/78    Assessment & Plan:  Migraine - Plan: ketorolac (TORADOL) injection 60 mg, rizatriptan (MAXALT) 5 MG tablet, promethazine (PHENERGAN) 25 MG tablet   Ms. Gwendolyn [name] is a very pleasant 28 yr old female here with migraine.  Symptoms are consistent with her typical headaches.  Imitrex did not relieve symptoms.  She has had good relief with toradol and phenergan in the past.  WIll give 60mg  toradol IM.  Since she has to drive home, will not do IM phenergan.  Pt can take PO when she gets home.  Encouraged pt to lay down in a cool, dark room when she gets home.  Note provided for work today.  Will try Maxalt going forward for abortive therapy.  We discussed the possibility of seeing a headache specialist if continuing to have frequent migraines.  If any symptoms are worsening, not improving, or she develops new symptoms, pt will RTC    E. Frances FurbishElizabeth Infinity Jeffords MHS, PA-C [facility] 3/22/20154:27 PM

## 2013-10-28 NOTE — Patient Instructions (Signed)
Take the phenergan when you get home - will probably make you sleepy  Going forward, try the Maxalt at the first sign of a migraine  If you are still getting migraines every couple of weeks, we can consider seeing a headache specialist

## 2013-11-06 ENCOUNTER — Ambulatory Visit (INDEPENDENT_AMBULATORY_CARE_PROVIDER_SITE_OTHER): Payer: 59 | Admitting: Certified Nurse Midwife

## 2013-11-06 ENCOUNTER — Encounter: Payer: Self-pay | Admitting: Certified Nurse Midwife

## 2013-11-06 VITALS — BP 118/70 | HR 74 | Resp 16 | Ht 67.75 in | Wt 251.0 lb

## 2013-11-06 DIAGNOSIS — Z0000 Encounter for general adult medical examination without abnormal findings: Secondary | ICD-10-CM

## 2013-11-06 DIAGNOSIS — R5381 Other malaise: Secondary | ICD-10-CM

## 2013-11-06 DIAGNOSIS — Z8742 Personal history of other diseases of the female genital tract: Secondary | ICD-10-CM

## 2013-11-06 DIAGNOSIS — Z01419 Encounter for gynecological examination (general) (routine) without abnormal findings: Secondary | ICD-10-CM

## 2013-11-06 DIAGNOSIS — R5383 Other fatigue: Secondary | ICD-10-CM

## 2013-11-06 LAB — HEMOGLOBIN, FINGERSTICK: Hemoglobin, fingerstick: 13.2 g/dL (ref 12.0–16.0)

## 2013-11-06 LAB — VITAMIN D 25 HYDROXY (VIT D DEFICIENCY, FRACTURES): VIT D 25 HYDROXY: 18 ng/mL — AB (ref 30–89)

## 2013-11-06 NOTE — Progress Notes (Signed)
28 y.o. G0P0000 Single Caucasian Fe here for annual exam. Periods normal, no issues. Endocrine MD. Had patient stop Lomedia due to absorption effects. Thyroid medication not stable had new change in dose. Maybe interested in IUD. NO STD screening needed. Sees PCP for aex and labs. No other health issues today.  Patient's last menstrual period was 10/24/2013.          Sexually active: yes  The current method of family planning is condoms all the time.    Exercising: yes  walking & running Smoker:  no  Health Maintenance: Pap: 11-02-12 ASCUS +HPV HR MMG:  none Colonoscopy:  none BMD:   none TDaP:  2009 Labs: Hgb-13.2 Self breast exam: not done   reports that she has never smoked. She does not have any smokeless tobacco history on file. She reports that she does not drink alcohol or use illicit drugs.  Past Medical History  Diagnosis Date  . Seasonal allergies     rhinitis  . Anemia     PMH of  . Thyroid disease     hypothyroidism  . Hyperlipidemia 07/20/2010    LDL 150, HDL 37.9  . Nonspecific elevation of levels of transaminase or lactic acid dehydrogenase (LDH) 07/20/2010    PMH of; ALT 47   . Abnormal pap     9/11Hx abn.pap/colpo--bx neg, 3/14 ASCUS +HPV no colpo due to age    Past Surgical History  Procedure Laterality Date  . Wisdom tooth extraction    . Knee arthroscopy  2012    L; Dr Shelle Iron  . Colposcopy      2011 neg    Current Outpatient Prescriptions  Medication Sig Dispense Refill  . levothyroxine (SYNTHROID, LEVOTHROID) 175 MCG tablet Take 1 tablet (175 mcg total) by mouth daily before breakfast.  90 tablet  3  . Multiple Vitamin (MULTIVITAMIN) tablet Take 1 tablet by mouth daily.      . Probiotic Product (PROBIOTIC DAILY PO) Take by mouth. 1 week on, 1 week off per GYN      . promethazine (PHENERGAN) 25 MG tablet Take 1 tablet (25 mg total) by mouth every 8 (eight) hours as needed for nausea or vomiting.  20 tablet  0  . rizatriptan (MAXALT) 5 MG tablet Take  1 tablet (5 mg total) by mouth as needed for migraine. May repeat in 2 hours if needed  10 tablet  0   No current facility-administered medications for this visit.    Family History  Problem Relation Age of Onset  . Depression Mother     ? onset in mid 26s  . Anxiety disorder Mother   . Hypertension Mother   . Asthma Father   . Diabetes Father   . Thyroid nodules Father     S/P thyroidectomy  . Heart disease Father     S/P defibrillator  . Cancer Maternal Grandmother     ?? female cancer  . Cancer Maternal Grandfather     esophageal  . Thyroid nodules Paternal Grandmother     ROS:  Pertinent items are noted in HPI.  Otherwise, a comprehensive ROS was negative.  Exam:   BP 118/70  Pulse 74  Resp 16  Ht 5' 7.75" (1.721 m)  Wt 251 lb (113.853 kg)  BMI 38.44 kg/m2  LMP 10/24/2013 Height: 5' 7.75" (172.1 cm)  Ht Readings from Last 3 Encounters:  11/06/13 5' 7.75" (1.721 m)  10/28/13 5' 7.75" (1.721 m)  09/17/13 5\' 8"  (1.727 m)  General appearance: alert, cooperative and appears stated age Head: Normocephalic, without obvious abnormality, atraumatic Neck: no adenopathy, supple, symmetrical, trachea midline and thyroid normal to inspection and palpation and non-palpable Lungs: clear to auscultation bilaterally Breasts: normal appearance, no masses or tenderness, No nipple retraction or dimpling, No nipple discharge or bleeding, No axillary or supraclavicular adenopathy Heart: regular rate and rhythm Abdomen: soft, non-tender; no masses,  no organomegaly Extremities: extremities normal, atraumatic, no cyanosis or edema Skin: Skin color, texture, turgor normal. No rashes or lesions Lymph nodes: Cervical, supraclavicular, and axillary nodes normal. No abnormal inguinal nodes palpated Neurologic: Grossly normal   Pelvic: External genitalia:  no lesions              Urethra:  normal appearing urethra with no masses, tenderness or lesions              Bartholin's and  Skene's: normal                 Vagina: normal appearing vagina with normal color and discharge, no lesions              Cervix: non tender, normal appearance              Pap taken: yes Bimanual Exam:  Uterus:  normal size, contour, position, consistency, mobility, non-tender and anteverted              Adnexa: normal adnexa and no mass, fullness, tenderness               Rectovaginal: Confirms               Anus:  deferred  A:  Well Woman with normal exam  Contraception condoms, patient was taken off OCP by endocrine due regulation of medication issues  Hypothyroid unstable medication  History of ASCUS pap with + HPV follow up pap today  P:   Reviewed health and wellness pertinent to exam  Discussed option of Skyla IUD for contraception, risk/benefits and handout given. Patient to check with MD regarding and will call to have precert if agreeable to use and then will come in on menses for insertion.  Continue follow up with MD as indicated.  Pap smear as per guidelines   pap smear taken today with reflex If pap negative will repeat in one year, if not per results. Patient aware of above recommendations.  counseled on breast self exam, STD prevention, HIV risk factors and prevention, adequate intake of calcium and vitamin D, diet and exercise  return annually or prn  An After Visit Summary was printed and given to the patient.

## 2013-11-06 NOTE — Patient Instructions (Signed)
General topics  Next pap or exam is  due in 1 year Take a Women's multivitamin Take 1200 mg. of calcium daily - prefer dietary If any concerns in interim to call back  Breast Self-Awareness Practicing breast self-awareness may pick up problems early, prevent significant medical complications, and possibly save your life. By practicing breast self-awareness, you can become familiar with how your breasts look and feel and if your breasts are changing. This allows you to notice changes early. It can also offer you some reassurance that your breast health is good. One way to learn what is normal for your breasts and whether your breasts are changing is to do a breast self-exam. If you find a lump or something that was not present in the past, it is best to contact your caregiver right away. Other findings that should be evaluated by your caregiver include nipple discharge, especially if it is bloody; skin changes or reddening; areas where the skin seems to be pulled in (retracted); or new lumps and bumps. Breast pain is seldom associated with cancer (malignancy), but should also be evaluated by a caregiver. BREAST SELF-EXAM The best time to examine your breasts is 5 7 days after your menstrual period is over.  ExitCare Patient Information 2013 [employer].   Exercise to Stay Healthy Exercise helps you become and stay healthy. EXERCISE IDEAS AND TIPS Choose exercises that:  You enjoy.  Fit into your day. You do not need to exercise really hard to be healthy. You can do exercises at a slow or medium level and stay healthy. You can:  Stretch before and after working out.  Try yoga, Pilates, or tai chi.  Lift weights.  Walk fast, swim, jog, run, climb stairs, bicycle, dance, or rollerskate.  Take aerobic classes. Exercises that burn about 150 calories:  Running 1  miles in 15 minutes.  Playing volleyball for 45 to 60 minutes.  Washing and waxing a car for 45 to 60  minutes.  Playing touch football for 45 minutes.  Walking 1  miles in 35 minutes.  Pushing a stroller 1  miles in 30 minutes.  Playing basketball for 30 minutes.  Raking leaves for 30 minutes.  Bicycling 5 miles in 30 minutes.  Walking 2 miles in 30 minutes.  Dancing for 30 minutes.  Shoveling snow for 15 minutes.  Swimming laps for 20 minutes.  Walking up stairs for 15 minutes.  Bicycling 4 miles in 15 minutes.  Gardening for 30 to 45 minutes.  Jumping rope for 15 minutes.  Washing windows or floors for 45 to 60 minutes. Document Released: 08/28/2010 Document Revised: 10/18/2011 Document Reviewed: 08/28/2010 ExitCare Patient Information 2013 ExitCare[employer].   Other topics ( that may be useful information):    Sexually Transmitted Disease Sexually transmitted disease (STD) refers to any infection that is passed from person to person during sexual activity. This may happen by way of saliva, semen, blood, vaginal mucus, or urine. Common STDs include:  Gonorrhea.  Chlamydia.  Syphilis.  HIV/AIDS.  Genital herpes.  Hepatitis B and C.  Trichomonas.  Human papillomavirus (HPV).  Pubic lice. CAUSES  An STD may be spread by bacteria, virus, or parasite. A person can get an STD by:  Sexual intercourse with an infected person.  Sharing sex toys with an infected person.  Sharing needles with an infected person.  Having intimate contact with the genitals, mouth, or rectal areas of an infected person. SYMPTOMS  Some people may not have any symptoms, but   they can still pass the infection to others. Different STDs have different symptoms. Symptoms include:  Painful or bloody urination.  Pain in the pelvis, abdomen, vagina, anus, throat, or eyes.  Skin rash, itching, irritation, growths, or sores (lesions). These usually occur in the genital or anal area.  Abnormal vaginal discharge.  Penile discharge in men.  Soft, flesh-colored skin growths in the  genital or anal area.  Fever.  Pain or bleeding during sexual intercourse.  Swollen glands in the groin area.  Yellow skin and eyes (jaundice). This is seen with hepatitis. DIAGNOSIS  To make a diagnosis, your caregiver may:  Take a medical history.  Perform a physical exam.  Take a specimen (culture) to be examined.  Examine a sample of discharge under a microscope.  Perform blood test TREATMENT   Chlamydia, gonorrhea, trichomonas, and syphilis can be cured with antibiotic medicine.  Genital herpes, hepatitis, and HIV can be treated, but not cured, with prescribed medicines. The medicines will lessen the symptoms.  Genital warts from HPV can be treated with medicine or by freezing, burning (electrocautery), or surgery. Warts may come back.  HPV is a virus and cannot be cured with medicine or surgery.However, abnormal areas may be followed very closely by your caregiver and may be removed from the cervix, vagina, or vulva through office procedures or surgery. If your diagnosis is confirmed, your recent sexual partners need treatment. This is true even if they are symptom-free or have a negative culture or evaluation. They should not have sex until their caregiver says it is okay. HOME CARE INSTRUCTIONS  All sexual partners should be informed, tested, and treated for all STDs.  Take your antibiotics as directed. Finish them even if you start to feel better.  Only take over-the-counter or prescription medicines for pain, discomfort, or fever as directed by your caregiver.  Rest.  Eat a balanced diet and drink enough fluids to keep your urine clear or pale yellow.  Do not have sex until treatment is completed and you have followed up with your caregiver. STDs should be checked after treatment.  Keep all follow-up appointments, Pap tests, and blood tests as directed by your caregiver.  Only use latex condoms and water-soluble lubricants during sexual activity. Do not use  petroleum jelly or oils.  Avoid alcohol and illegal drugs.  Get vaccinated for HPV and hepatitis. If you have not received these vaccines in the past, talk to your caregiver about whether one or both might be right for you.  Avoid risky sex practices that can break the skin. The only way to avoid getting an STD is to avoid all sexual activity.Latex condoms and dental dams (for oral sex) will help lessen the risk of getting an STD, but will not completely eliminate the risk. SEEK MEDICAL CARE IF:   You have a fever.  You have any new or worsening symptoms. Document Released: 10/16/2002 Document Revised: 10/18/2011 Document Reviewed: 10/23/2010 [facility] Patient Information 2013 [employer].    Domestic Abuse You are being battered or abused if someone close to you hits, pushes, or physically hurts you in any way. You also are being abused if you are forced into activities. You are being sexually abused if you are forced to have sexual contact of any kind. You are being emotionally abused if you are made to feel worthless or if you are constantly threatened. It is important to remember that help is available. No one has the right to abuse you. PREVENTION OF FURTHER  ABUSE  Learn the warning signs of danger. This varies with situations but may include: the use of alcohol, threats, isolation from friends and family, or forced sexual contact. Leave if you feel that violence is going to occur.  If you are attacked or beaten, report it to the police so the abuse is documented. You do not have to press charges. The police can protect you while you or the attackers are leaving. Get the officer's name and badge number and a copy of the report.  Find someone you can trust and tell them what is happening to you: your caregiver, a nurse, clergy member, close friend or family member. Feeling ashamed is natural, but remember that you have done nothing wrong. No one deserves abuse. Document Released:  07/23/2000 Document Revised: 10/18/2011 Document Reviewed: 10/01/2010 ExitCare Patient Information 2013 ExitCare[employer] LLC.    How Much is Too Much Alcohol? Drinking too much alcohol can cause injury, accidents, and health problems. These types of problems can include:   Car crashes.  Falls.  Family fighting (domestic violence).  Drowning.  Fights.  Injuries.  Burns.  Damage to certain organs.  Having a baby with birth defects. ONE DRINK CAN BE TOO MUCH WHEN YOU ARE:  Working.  Pregnant or breastfeeding.  Taking medicines. Ask your doctor.  Driving or planning to drive. If you or someone you know has a drinking problem, get help from a doctor.  Document Released: 05/22/2009 Document Revised: 10/18/2011 Document Reviewed: 05/22/2009 ExitCare Patient Information 2013 ExitCare[employer] LLC.   Smoking Hazards Smoking cigarettes is extremely bad for your health. Tobacco smoke has over 200 known poisons in it. There are over 60 chemicals in tobacco smoke that cause cancer. Some of the chemicals found in cigarette smoke include:   Cyanide.  Benzene.  Formaldehyde.  Methanol (wood alcohol).  Acetylene (fuel used in welding torches).  Ammonia. Cigarette smoke also contains the poisonous gases nitrogen oxide and carbon monoxide.  Cigarette smokers have an increased risk of many serious medical problems and Smoking causes approximately:  90% of all lung cancer deaths in men.  80% of all lung cancer deaths in women.  90% of deaths from chronic obstructive lung disease. Compared with nonsmokers, smoking increases the risk of:  Coronary heart disease by 2 to 4 times.  Stroke by 2 to 4 times.  Men developing lung cancer by 23 times.  Women developing lung cancer by 13 times.  Dying from chronic obstructive lung diseases by 12 times.  . Smoking is the most preventable cause of death and disease in our society.  WHY IS SMOKING ADDICTIVE?  Nicotine is the chemical  agent in tobacco that is capable of causing addiction or dependence.  When you smoke and inhale, nicotine is absorbed rapidly into the bloodstream through your lungs. Nicotine absorbed through the lungs is capable of creating a powerful addiction. Both inhaled and non-inhaled nicotine may be addictive.  Addiction studies of cigarettes and spit tobacco show that addiction to nicotine occurs mainly during the teen years, when young people begin using tobacco products. WHAT ARE THE BENEFITS OF QUITTING?  There are many health benefits to quitting smoking.   Likelihood of developing cancer and heart disease decreases. Health improvements are seen almost immediately.  Blood pressure, pulse rate, and breathing patterns start returning to normal soon after quitting. QUITTING SMOKING   American Lung Association - 1-800-LUNGUSA  American Cancer Society - 1-800-ACS-2345 Document Released: 09/02/2004 Document Revised: 10/18/2011 Document Reviewed: 05/07/2009 ExitCare Patient Information 2013 ExitCare,   LLC.   Stress Management Stress is a state of physical or mental tension that often results from changes in your life or normal routine. Some common causes of stress are:  Death of a loved one.  Injuries or severe illnesses.  Getting fired or changing jobs.  Moving into a new home. Other causes may be:  Sexual problems.  Business or financial losses.  Taking on a large debt.  Regular conflict with someone at home or at work.  Constant tiredness from lack of sleep. It is not just bad things that are stressful. It may be stressful to:  Win the lottery.  Get married.  Buy a new car. The amount of stress that can be easily tolerated varies from person to person. Changes generally cause stress, regardless of the types of change. Too much stress can affect your health. It may lead to physical or emotional problems. Too little stress (boredom) may also become stressful. SUGGESTIONS TO  REDUCE STRESS:  Talk things over with your family and friends. It often is helpful to share your concerns and worries. If you feel your problem is serious, you may want to get help from a professional counselor.  Consider your problems one at a time instead of lumping them all together. Trying to take care of everything at once may seem impossible. List all the things you need to do and then start with the most important one. Set a goal to accomplish 2 or 3 things each day. If you expect to do too many in a single day you will naturally fail, causing you to feel even more stressed.  Do not use alcohol or drugs to relieve stress. Although you may feel better for a short time, they do not remove the problems that caused the stress. They can also be habit forming.  Exercise regularly - at least 3 times per week. Physical exercise can help to relieve that "uptight" feeling and will relax you.  The shortest distance between despair and hope is often a good night's sleep.  Go to bed and get up on time allowing yourself time for appointments without being rushed.  Take a short "time-out" period from any stressful situation that occurs during the day. Close your eyes and take some deep breaths. Starting with the muscles in your face, tense them, hold it for a few seconds, then relax. Repeat this with the muscles in your neck, shoulders, hand, stomach, back and legs.  Take good care of yourself. Eat a balanced diet and get plenty of rest.  Schedule time for having fun. Take a break from your daily routine to relax. HOME CARE INSTRUCTIONS   Call if you feel overwhelmed by your problems and feel you can no longer manage them on your own.  Return immediately if you feel like hurting yourself or someone else. Document Released: 01/19/2001 Document Revised: 10/18/2011 Document Reviewed: 09/11/2007 ExitCare Patient Information 2013 ExitCare, LLC.   

## 2013-11-06 NOTE — Progress Notes (Signed)
Reviewed personally.  M. Suzanne Rihan Schueler, MD.  

## 2013-11-07 ENCOUNTER — Other Ambulatory Visit: Payer: Self-pay | Admitting: Certified Nurse Midwife

## 2013-11-07 DIAGNOSIS — E559 Vitamin D deficiency, unspecified: Secondary | ICD-10-CM

## 2013-11-07 LAB — IPS PAP TEST WITH REFLEX TO HPV

## 2013-11-12 ENCOUNTER — Encounter: Payer: Self-pay | Admitting: Endocrinology

## 2013-11-25 ENCOUNTER — Ambulatory Visit (INDEPENDENT_AMBULATORY_CARE_PROVIDER_SITE_OTHER): Payer: 59 | Admitting: Family Medicine

## 2013-11-25 VITALS — BP 122/90 | HR 99 | Temp 98.2°F | Resp 16 | Ht 67.75 in | Wt 251.0 lb

## 2013-11-25 DIAGNOSIS — H6120 Impacted cerumen, unspecified ear: Secondary | ICD-10-CM

## 2013-11-25 DIAGNOSIS — J329 Chronic sinusitis, unspecified: Secondary | ICD-10-CM

## 2013-11-25 MED ORDER — AMOXICILLIN 875 MG PO TABS: 875.0000 mg | ORAL_TABLET | Freq: Two times a day (BID) | ORAL | Status: DC

## 2013-11-25 NOTE — Patient Instructions (Signed)

## 2013-11-25 NOTE — Progress Notes (Signed)
°  This chart was scribed for Johnn HaiKurt Lauestein, MD by Joaquin MusicKristina Sanchez-Matthews, ED Scribe. This patient was seen in room Room/bed 10 and the patient's care was started at 1:22 PM. Subjective:    Patient ID: Gwendolyn [name], female    DOB: May 18, 1986, 28 y.o.   MRN: [id number] Chief Complaint  Patient presents with   Nasal Congestion    x 3 days   Sore Throat   Sinusitis   HPI Gwendolyn R Alfredo BachCecil is a 28 y.o. female who [facility] of ongoing sore throat, sinusitis, and nasal congestion that began 3 days ago. Pt states her nasal congestion has worsened. She denies having bilateral ear pain but states she is having constant bilateral ear pressure and reports having a popping sensation that occurred frequently.  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 Negative for ear pain.   Respiratory: Positive for cough.    Objective:   Physical Exam  Nursing note and vitals reviewed. Constitutional: She is oriented to person, place, and time. She appears well-developed and well-nourished. No distress.  HENT:  Head: Normocephalic and atraumatic.  Right Ear: External ear normal.  Left Ear: External ear normal.  Cerumen impaction to R ear, which was cleaned out. Sinus congestion. Swollen nasal passages bilaterally.  Eyes: Conjunctivae are normal. Pupils are equal, round, and reactive to light.  Neck: Normal range of motion. Neck supple. No thyromegaly present.  Cardiovascular: Normal rate and regular rhythm.   No murmur heard. Pulmonary/Chest: Effort normal and breath sounds normal. She has no wheezes.  Abdominal: Bowel sounds are normal. She exhibits no mass. There is no rebound and no guarding.  Musculoskeletal: Normal range of motion. She exhibits no tenderness.  Neurological: She is alert and oriented to person, place, and time. She has normal reflexes.  Skin: Skin is warm and  dry. She is not diaphoretic.  Psychiatric: She has a normal mood and affect. Her behavior is normal.   Triage Vitals:BP 122/90   Pulse 99   Temp(Src) 98.2 F (36.8 C) (Oral)   Resp 16   Ht 5' 7.75" (1.721 m)   Wt 251 lb (113.853 kg)   BMI 38.44 kg/m2   SpO2 99%   LMP 11/22/2013 Assessment & Plan:   Meds ordered this encounter  Medications   amoxicillin (AMOXIL) 875 MG tablet    Sig: Take 1 tablet (875 mg total) by mouth 2 (two) times daily.    Dispense:  20 tablet    Refill:  0     Sinusitis - Plan: amoxicillin (AMOXIL) 875 MG tablet  Cerumen impaction  Signed, Elvina SidleKurt Lauenstein, MD

## 2013-12-20 ENCOUNTER — Telehealth: Payer: Self-pay

## 2013-12-20 ENCOUNTER — Ambulatory Visit (INDEPENDENT_AMBULATORY_CARE_PROVIDER_SITE_OTHER): Payer: 59 | Admitting: Physician Assistant

## 2013-12-20 VITALS — BP 116/86 | HR 86 | Temp 97.7°F | Resp 16

## 2013-12-20 DIAGNOSIS — L259 Unspecified contact dermatitis, unspecified cause: Secondary | ICD-10-CM

## 2013-12-20 DIAGNOSIS — G43909 Migraine, unspecified, not intractable, without status migrainosus: Secondary | ICD-10-CM

## 2013-12-20 DIAGNOSIS — E039 Hypothyroidism, unspecified: Secondary | ICD-10-CM

## 2013-12-20 DIAGNOSIS — L255 Unspecified contact dermatitis due to plants, except food: Secondary | ICD-10-CM

## 2013-12-20 DIAGNOSIS — G43109 Migraine with aura, not intractable, without status migrainosus: Secondary | ICD-10-CM

## 2013-12-20 MED ORDER — RIZATRIPTAN BENZOATE 5 MG PO TABS: 5.0000 mg | ORAL_TABLET | ORAL | Status: DC | PRN

## 2013-12-20 MED ORDER — PROPRANOLOL HCL 20 MG PO TABS: 20.0000 mg | ORAL_TABLET | Freq: Two times a day (BID) | ORAL | Status: DC

## 2013-12-20 MED ORDER — TRIAMCINOLONE ACETONIDE 0.1 % EX CREA: 1.0000 "application " | TOPICAL_CREAM | Freq: Two times a day (BID) | CUTANEOUS | Status: DC

## 2013-12-20 NOTE — Patient Instructions (Signed)
Take the propranolol 20mg  twice daily - let's give this a 4 week trial  Keep an eye on your pulse/blood pressure  Continue to use the Maxalt and phenergan if needed for acute migraines  Use the triamcinolone cream twice daily to the affected area on your arm - if worsening or no improvement, let me know

## 2013-12-20 NOTE — Progress Notes (Signed)
Subjective:    Patient ID: Gwendolyn [name], female    DOB: 05-09-1986, 28 y.o.   MRN: [id number]  HPI   Gwendolyn [name] is a very pleasant 28 yr old female here for follow up on migraines.  She is getting good relief with Maxalt but continues to have migraines about every 2 weeks.  When she does get headaches they are debilitating.  She has not really noticed any specific triggers.  Knows that stress does play a role.  Thought perhaps aspartame was a trigger but has eliminated that and continues to have migraines.   She is interested in a trying a prophylactic migraine medicine.  She is starting back to school in the fall (biology at [employer]) and is concerned about how her migraines will affect school work.  Migraines seem to be somewhat improved with better control of hypothyroidism.  She does admit to palpitations recently.  At last refill of meds was changed from branded Synthroid to levothyroxine.  Last TSH wnl in March 2015.  Appt with Dr. Everardo AllEllison in September  Additionally has itchy rash at left arm.  Some involvement of right arm.  Was on a course of amox and thought possible allergic rxn but has been done with amox for awhile now.  No new exposures.  Thought heat rash but not improving.  Using cortisone TID  Review of Systems  Constitutional: Negative.   Respiratory: Negative.   Cardiovascular: Positive for palpitations.  Skin: Positive for rash.  Neurological: Positive for headaches.       Objective:   Physical Exam  Vitals reviewed. Constitutional: She is oriented to person, place, and time. She appears well-developed and well-nourished. No distress.  HENT:  Head: Normocephalic and atraumatic.  Eyes: Conjunctivae are normal. No scleral icterus.  Cardiovascular: Normal rate, regular rhythm and normal heart sounds.   Pulmonary/Chest: Effort normal and breath sounds normal. She has no wheezes. She has no rales.  Neurological: She is alert and oriented to person, place, and time.  Skin:  Skin is warm and dry.     Large patch of erythematous papules; multiple excoriations; no vesicles or pustules; no other involved areas; appears c/w contact derm or eczema  Psychiatric: She has a normal mood and affect. Her behavior is normal.        Assessment & Plan:  Gwendolyn [name] is a very pleasant 28 yr old female here with 1. Migraine Pt continues to struggle with migraines.  Having debilitating HAs about every 2 wks.  Good relief with maxalt but would like preventive medication.  We discussed options includine topamax, BB, CCB, TCAs.  We discussed risks and benefits of medications.  We opted for a trial of beta blocker - this may also have the added benefit in improving her palpitations.  Start with 20mg  propranolol BID.  Will give a 4 wk trial and then consider increasing dose or changing medication based on response.  Cautioned for potential hypotension and bradycardia  - rizatriptan (MAXALT) 5 MG tablet; Take 1 tablet (5 mg total) by mouth as needed for migraine. May repeat in 2 hours if needed  Dispense: 10 tablet; Refill: 2 - propranolol (INDERAL) 20 MG tablet; Take 1 tablet (20 mg total) by mouth 2 (two) times daily.  Dispense: 60 tablet; Refill: 2  2. Hypothyroidism Pt with poorly controlled hypothyroidism over the last several months.  Last TSH wnl in March but since then has changed from branded to generic drug.  Now with occ palpitations.  Will  check TSH and fwd to Dr. Everardo AllEllison per pt's request  - TSH  3. Contact dermatitis Pruritic rash at LEFT arm.  Appears c/w with contact/eczematous dermatitis.  Will d/c cortisone.  Try triamcinolone BID.  Also try emollient such as eucerin.    - triamcinolone cream (KENALOG) 0.1 %; Apply 1 application topically 2 (two) times daily.  Dispense: 45 g; Refill: 0   Pt to call or RTC if worsening or not improving  E. Frances FurbishElizabeth Jabes Primo MHS, PA-C [facility] 5/14/201510:03 PM

## 2013-12-20 NOTE — Telephone Encounter (Signed)
Transfer from [facility] Unable to reach prior to visit  Flu vaccine--05/2013  Pap--10/2013--neg Tdap--2009

## 2013-12-21 ENCOUNTER — Encounter: Payer: Self-pay | Admitting: Physician Assistant

## 2013-12-21 ENCOUNTER — Other Ambulatory Visit: Payer: Self-pay | Admitting: Internal Medicine

## 2013-12-21 ENCOUNTER — Ambulatory Visit (INDEPENDENT_AMBULATORY_CARE_PROVIDER_SITE_OTHER): Payer: 59 | Admitting: Internal Medicine

## 2013-12-21 ENCOUNTER — Other Ambulatory Visit: Payer: Self-pay | Admitting: Physician Assistant

## 2013-12-21 ENCOUNTER — Encounter: Payer: Self-pay | Admitting: Internal Medicine

## 2013-12-21 VITALS — BP 132/82 | HR 75 | Temp 98.2°F | Ht 68.0 in | Wt 249.0 lb

## 2013-12-21 DIAGNOSIS — E559 Vitamin D deficiency, unspecified: Secondary | ICD-10-CM

## 2013-12-21 DIAGNOSIS — G43109 Migraine with aura, not intractable, without status migrainosus: Secondary | ICD-10-CM

## 2013-12-21 DIAGNOSIS — R002 Palpitations: Secondary | ICD-10-CM | POA: Insufficient documentation

## 2013-12-21 DIAGNOSIS — Z0000 Encounter for general adult medical examination without abnormal findings: Secondary | ICD-10-CM

## 2013-12-21 DIAGNOSIS — G479 Sleep disorder, unspecified: Secondary | ICD-10-CM

## 2013-12-21 DIAGNOSIS — E039 Hypothyroidism, unspecified: Secondary | ICD-10-CM

## 2013-12-21 LAB — TSH: TSH: 9.786 u[IU]/mL — AB (ref 0.350–4.500)

## 2013-12-21 LAB — CBC WITH DIFFERENTIAL/PLATELET
BASOS PCT: 0.7 % (ref 0.0–3.0)
Basophils Absolute: 0 10*3/uL (ref 0.0–0.1)
EOS PCT: 6 % — AB (ref 0.0–5.0)
Eosinophils Absolute: 0.4 10*3/uL (ref 0.0–0.7)
HEMATOCRIT: 39.9 % (ref 36.0–46.0)
HEMOGLOBIN: 13.1 g/dL (ref 12.0–15.0)
LYMPHS ABS: 1.9 10*3/uL (ref 0.7–4.0)
Lymphocytes Relative: 28.5 % (ref 12.0–46.0)
MCHC: 32.8 g/dL (ref 30.0–36.0)
MCV: 84.2 fl (ref 78.0–100.0)
MONOS PCT: 5.2 % (ref 3.0–12.0)
Monocytes Absolute: 0.3 10*3/uL (ref 0.1–1.0)
NEUTROS ABS: 3.9 10*3/uL (ref 1.4–7.7)
Neutrophils Relative %: 59.6 % (ref 43.0–77.0)
Platelets: 359 10*3/uL (ref 150.0–400.0)
RBC: 4.74 Mil/uL (ref 3.87–5.11)
RDW: 13.2 % (ref 11.5–15.5)
WBC: 6.6 10*3/uL (ref 4.0–10.5)

## 2013-12-21 LAB — COMPREHENSIVE METABOLIC PANEL
ALK PHOS: 62 U/L (ref 39–117)
ALT: 31 U/L (ref 0–35)
AST: 26 U/L (ref 0–37)
Albumin: 4.3 g/dL (ref 3.5–5.2)
BILIRUBIN TOTAL: 0.6 mg/dL (ref 0.2–1.2)
BUN: 13 mg/dL (ref 6–23)
CO2: 27 meq/L (ref 19–32)
CREATININE: 0.8 mg/dL (ref 0.4–1.2)
Calcium: 9.3 mg/dL (ref 8.4–10.5)
Chloride: 104 mEq/L (ref 96–112)
GFR: 90.92 mL/min (ref >60.00)
Glucose, Bld: 88 mg/dL (ref 70–99)
Potassium: 3.6 mEq/L (ref 3.5–5.1)
Sodium: 139 mEq/L (ref 135–145)
Total Protein: 7.7 g/dL (ref 6.0–8.3)

## 2013-12-21 LAB — LIPID PANEL
CHOLESTEROL: 166 mg/dL (ref 0–200)
HDL: 48.4 mg/dL (ref >39.00)
LDL Cholesterol: 101 mg/dL — ABNORMAL HIGH (ref 0–99)
Total CHOL/HDL Ratio: 3
Triglycerides: 82 mg/dL (ref 0.0–149.0)
VLDL: 16.4 mg/dL (ref 0.0–40.0)

## 2013-12-21 NOTE — Assessment & Plan Note (Signed)
Reports currently she is sleeping better.

## 2013-12-21 NOTE — Progress Notes (Signed)
Pre visit review using our clinic review tool, if applicable. No additional management support is needed unless otherwise documented below in the visit note. 

## 2013-12-21 NOTE — Assessment & Plan Note (Signed)
Recently seen elsewhere , was rx medication. See medication list

## 2013-12-21 NOTE — Assessment & Plan Note (Signed)
Currently taking 2000 units of vitamin D OTC daily

## 2013-12-21 NOTE — Progress Notes (Signed)
Opened in error

## 2013-12-21 NOTE — Progress Notes (Signed)
Subjective:    Patient ID: Gwendolyn [name], female    DOB: 1986/04/02, 28 y.o.   MRN: [id number]  DOS:  12/21/2013 Type of  visit: CPX, new patient to me, used to see Dr. Alwyn RenHopper Reports occasional palpitations, approximately 2 episodes a week, may happen at work-home or may wake her up from sleep; last few seconds to a few minutes; Only one time she felt slightly sweaty but denies syncope or nausea. There is no pattern . Other issues discussed, see assessment and plan.  ROS No lower extremity edema. No blood in the stools or GERD symptoms. Occasional abd pain when she drinks milk. Denies cough, sputum production or respiratory symptoms Some stress but in general doing well without anxiety or depression.   Past Medical History  Diagnosis Date  . Seasonal allergies     rhinitis  . Anemia     PMH of  . Hypothyroid     hypothyroidism  . Hyperlipidemia 07/20/2010    LDL 150, HDL 37.9  . Nonspecific elevation of levels of transaminase or lactic acid dehydrogenase (LDH) 07/20/2010    PMH of; ALT 47   . Abnormal pap     9/11Hx abn.pap/colpo--bx neg, 3/14 ASCUS +HPV no colpo due to age  . Migraine headache with aura 03/13/2013    See 03/13/13 throbbing left frontal headache with radiation to the occiput. Possible prodrome of flushing. Oral and blurred vision and photosensitivity.  Family history of migraines in her mother     Past Surgical History  Procedure Laterality Date  . Wisdom tooth extraction    . Knee arthroscopy  2012    L; Dr Shelle IronBeane  . Colposcopy      2011 neg    History   Social History  . Marital Status: Single    Spouse Name: N/A    Number of Children: 0  . Years of Education: N/A   Occupational History  . 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 Smoking status: Never Smoker   . Smokeless tobacco: Never Used  . Alcohol Use: No     Comment: very rare  . Drug Use: No  . Sexual Activity: Yes    Partners: Male    Birth Control/ Protection: Condom    Other Topics Concern  . Not on file   Social History Narrative   Lives w/ parents    Going back to school      Family History  Problem Relation Age of Onset  . Depression Mother     ? onset in mid 6640s  . Hypertension Mother   . Asthma Father   . Diabetes Father   . Thyroid nodules Father     S/P thyroidectomy  . Heart disease Father     S/P defibrillator  . Cancer Maternal Grandmother     ?? female cancer  . Cancer Maternal Grandfather     esophageal  . Thyroid nodules Paternal Grandmother   . Colon cancer Neg Hx   . Breast cancer Neg Hx       Medication List       This list is accurate as of: 12/21/13 11:59 PM.  Always use your most recent med list.               levothyroxine 175 MCG tablet  Commonly known as:  SYNTHROID, LEVOTHROID  Take 1 tablet (175 mcg total) by mouth daily before breakfast.     multivitamin tablet  Take 1  tablet by mouth daily.     PROBIOTIC DAILY PO  Take by mouth. 1 week on, 1 week off per GYN     promethazine 25 MG tablet  Commonly known as:  PHENERGAN  Take 1 tablet (25 mg total) by mouth every 8 (eight) hours as needed for nausea or vomiting.     propranolol 20 MG tablet  Commonly known as:  INDERAL  Take 1 tablet (20 mg total) by mouth 2 (two) times daily.     rizatriptan 5 MG tablet  Commonly known as:  MAXALT  Take 1 tablet (5 mg total) by mouth as needed for migraine. May repeat in 2 hours if needed     triamcinolone cream 0.1 %  Commonly known as:  KENALOG  Apply 1 application topically 2 (two) times daily.           Objective:   Physical Exam BP 132/82  Pulse 75  Temp(Src) 98.2 F (36.8 C)  Ht 5\' 8"  (1.727 m)  Wt 249 lb (112.946 kg)  BMI 37.87 kg/m2  SpO2 100%  LMP 11/22/2013  General -- alert, well-developed, NAD.  Neck --palpable but no tender thyroid, normal carotid pulse, no LAD HEENT-- Not pale.  Lungs -- normal respiratory effort, no intercostal retractions, no accessory muscle use, and  normal breath sounds.  Heart-- normal rate, regular rhythm, no murmur.  Abdomen-- Not distended, good bowel sounds,soft, non-tender.  Extremities-- no pretibial edema bilaterally  Neurologic--  alert & oriented X3. Speech normal, gait normal, strength normal in all extremities.  Psych-- Cognition and judgment appear intact. Cooperative with normal attention span and concentration. No anxious or depressed appearing.     Assessment & Plan:

## 2013-12-21 NOTE — Assessment & Plan Note (Signed)
Followup elsewhere, has seen Dr. Everardo AllEllison before

## 2013-12-21 NOTE — Assessment & Plan Note (Signed)
Td 09 Labs Diet and exercise discussed Female care per gyn

## 2013-12-21 NOTE — Assessment & Plan Note (Addendum)
Palpitations as described above, EKG done today ---> nsr If symptoms are more frequent or she has associated problems such as chest pain, near syncope, dizziness: She knows to call me. Otherwise return to the office in 3 months.

## 2013-12-21 NOTE — Patient Instructions (Signed)
Get your blood work before you leave     Next visit is for routine check up regards palpitations in 3 months

## 2013-12-22 LAB — VITAMIN D 25 HYDROXY (VIT D DEFICIENCY, FRACTURES): VIT D 25 HYDROXY: 35 ng/mL (ref 30–89)

## 2013-12-22 LAB — T3, FREE: T3 FREE: 3.1 pg/mL (ref 2.3–4.2)

## 2013-12-22 LAB — T4, FREE: Free T4: 1.23 ng/dL (ref 0.80–1.80)

## 2013-12-25 ENCOUNTER — Encounter: Payer: Self-pay | Admitting: Endocrinology

## 2013-12-28 ENCOUNTER — Telehealth: Payer: Self-pay | Admitting: Physician Assistant

## 2013-12-28 DIAGNOSIS — E039 Hypothyroidism, unspecified: Secondary | ICD-10-CM

## 2013-12-28 NOTE — Telephone Encounter (Signed)
Pt requests referral to endocrinologist Dr. Sharl Ma with [facility] for management of hypothyroidism

## 2014-02-06 ENCOUNTER — Other Ambulatory Visit: Payer: 59

## 2014-02-06 ENCOUNTER — Other Ambulatory Visit (INDEPENDENT_AMBULATORY_CARE_PROVIDER_SITE_OTHER): Payer: 59

## 2014-02-06 DIAGNOSIS — E559 Vitamin D deficiency, unspecified: Secondary | ICD-10-CM

## 2014-02-07 LAB — VITAMIN D 25 HYDROXY (VIT D DEFICIENCY, FRACTURES): Vit D, 25-Hydroxy: 33 ng/mL (ref 30–89)

## 2014-03-26 ENCOUNTER — Ambulatory Visit (INDEPENDENT_AMBULATORY_CARE_PROVIDER_SITE_OTHER): Payer: 59 | Admitting: Internal Medicine

## 2014-03-26 ENCOUNTER — Encounter: Payer: Self-pay | Admitting: Internal Medicine

## 2014-03-26 VITALS — BP 137/82 | HR 90 | Temp 98.4°F | Wt 255.2 lb

## 2014-03-26 DIAGNOSIS — R002 Palpitations: Secondary | ICD-10-CM

## 2014-03-26 DIAGNOSIS — E039 Hypothyroidism, unspecified: Secondary | ICD-10-CM

## 2014-03-26 LAB — TSH: TSH: 1.39 u[IU]/mL (ref 0.35–4.50)

## 2014-03-26 NOTE — Progress Notes (Signed)
Pre-visit discussion using our clinic review tool. No additional management support is needed unless otherwise documented below in the visit note.  

## 2014-03-26 NOTE — Progress Notes (Signed)
Subjective:    Patient ID: Gwendolyn [name], female    DOB: [birth year], 26 y.o.   MRN: [id number]  DOS:  03/26/2014 Type of visit - description: f/u History: Continue with palpitations: Described as her heart jumps, "statrtles", Symptoms last a minute or 2, has 2- 3 episodes a day, mostly at rest but sometimes at work. No associated shortness or breath, syncope, nausea, GERD symptoms. Is not taking caffeine due to migraines. She works full-time and  Is back in school but reports that doesn't feel anxious. Occasionally has a lateral left-sided soreness in the chest on and off for a while, not necessarily associated with palpitations. Also for many years has numbness at the right fifth finger after a wrist fracture. Denies neck pain.   ROS See HPI  Past Medical History  Diagnosis Date  . Seasonal allergies     rhinitis  . Anemia     PMH of  . Hypothyroid     hypothyroidism  . Hyperlipidemia 07/20/2010    LDL 150, HDL 37.9  . Nonspecific elevation of levels of transaminase or lactic acid dehydrogenase (LDH) 07/20/2010    PMH of; ALT 47   . Abnormal pap     9/11Hx abn.pap/colpo--bx neg, 3/14 ASCUS +HPV no colpo due to age  . Migraine headache with aura 03/13/2013    See 03/13/13 throbbing left frontal headache with radiation to the occiput. Possible prodrome of flushing. Oral and blurred vision and photosensitivity.  Family history of migraines in her mother   . Wrist fracture, right at age 45    no surgery    Past Surgical History  Procedure Laterality Date  . Wisdom tooth extraction    . Knee arthroscopy  2012    L; Dr Shelle Iron  . Colposcopy      2011 neg    History   Social History  . Marital Status: Single    Spouse Name: N/A    Number of Children: 0  . Years of Education: N/A   Occupational History  . 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 Smoking status: Never Smoker   . Smokeless tobacco: Never Used  . Alcohol Use: No     Comment: very rare  . Drug  Use: No  . Sexual Activity: Yes    Partners: Male    Birth Control/ Protection: Condom   Other Topics Concern  . Not on file   Social History Narrative   Lives w/ parents    Going back to school         Medication List       This list is accurate as of: 03/26/14  6:14 PM.  Always use your most recent med list.               levothyroxine 175 MCG tablet  Commonly known as:  SYNTHROID, LEVOTHROID  Take 1 tablet (175 mcg total) by mouth daily before breakfast.     multivitamin tablet  Take 1 tablet by mouth daily.     PROBIOTIC DAILY PO  Take by mouth. 1 week on, 1 week off per GYN     promethazine 25 MG tablet  Commonly known as:  PHENERGAN  Take 1 tablet (25 mg total) by mouth every 8 (eight) hours as needed for nausea or vomiting.     propranolol 20 MG tablet  Commonly known as:  INDERAL  Take 1 tablet (20 mg total) by mouth 2 (two) times daily.  rizatriptan 5 MG tablet  Commonly known as:  MAXALT  Take 1 tablet (5 mg total) by mouth as needed for migraine. May repeat in 2 hours if needed           Objective:   Physical Exam BP 137/82  Pulse 90  Temp(Src) 98.4 F (36.9 C) (Oral)  Wt 255 lb 4 oz (115.781 kg)  SpO2 99%  LMP 03/06/2014  General -- alert, well-developed, NAD.  Neck --no thyromegaly  HEENT-- Not pale. Lungs -- normal respiratory effort, no intercostal retractions, no accessory muscle use, and normal breath sounds.  Heart-- normal rate, regular rhythm, no murmur.   Extremities-- no pretibial edema bilaterally  Neurologic--  alert & oriented X3. Speech normal, gait appropriate for age, strength symmetric and appropriate for age.    Psych-- Cognition and judgment appear intact. Cooperative with normal attention span and concentration. No anxious or depressed appearing.       Assessment & Plan:

## 2014-03-26 NOTE — Assessment & Plan Note (Signed)
Continue with palpitations w/o associated   or worrisome symptoms. Recent EKG normal, no anemia, hypothyroidism is not over treated. Plan:  Holter monitor

## 2014-03-26 NOTE — Patient Instructions (Signed)
Get your blood work before you leave     Next visit is for routine check up in 6 months  

## 2014-03-26 NOTE — Assessment & Plan Note (Signed)
check a TSH

## 2014-03-27 LAB — THYROGLOBULIN ANTIBODY: Thyroglobulin Ab: <20 IU/mL (ref <40.0)

## 2014-04-05 ENCOUNTER — Encounter: Payer: Self-pay | Admitting: Radiology

## 2014-04-05 ENCOUNTER — Encounter (INDEPENDENT_AMBULATORY_CARE_PROVIDER_SITE_OTHER): Payer: 59

## 2014-04-05 DIAGNOSIS — R002 Palpitations: Secondary | ICD-10-CM

## 2014-04-05 NOTE — Progress Notes (Signed)
Patient ID: Gwendolyn [name], female   DOB: 20-Aug-1985, 28 y.o.   MRN: [id number] [facility] 48hr holter applied

## 2014-04-10 ENCOUNTER — Ambulatory Visit: Payer: 59 | Admitting: Endocrinology

## 2014-04-17 ENCOUNTER — Encounter: Payer: Self-pay | Admitting: Internal Medicine

## 2014-05-20 ENCOUNTER — Encounter: Payer: Self-pay | Admitting: Physician Assistant

## 2014-05-20 DIAGNOSIS — E039 Hypothyroidism, unspecified: Secondary | ICD-10-CM

## 2014-06-09 HISTORY — PX: ESOPHAGOGASTRODUODENOSCOPY: SHX1529

## 2014-06-18 ENCOUNTER — Ambulatory Visit (INDEPENDENT_AMBULATORY_CARE_PROVIDER_SITE_OTHER): Payer: 59 | Admitting: Family Medicine

## 2014-06-18 VITALS — BP 108/80 | HR 75 | Temp 97.9°F | Resp 16 | Ht 67.25 in | Wt 254.4 lb

## 2014-06-18 DIAGNOSIS — R112 Nausea with vomiting, unspecified: Secondary | ICD-10-CM

## 2014-06-18 DIAGNOSIS — R197 Diarrhea, unspecified: Secondary | ICD-10-CM

## 2014-06-18 DIAGNOSIS — R5381 Other malaise: Secondary | ICD-10-CM

## 2014-06-18 DIAGNOSIS — E039 Hypothyroidism, unspecified: Secondary | ICD-10-CM

## 2014-06-18 DIAGNOSIS — N3090 Cystitis, unspecified without hematuria: Secondary | ICD-10-CM

## 2014-06-18 DIAGNOSIS — R1084 Generalized abdominal pain: Secondary | ICD-10-CM

## 2014-06-18 LAB — POCT CBC
GRANULOCYTE PERCENT: 63.8 % (ref 37–80)
HCT, POC: 41.1 % (ref 37.7–47.9)
HEMOGLOBIN: 13.2 g/dL (ref 12.2–16.2)
Lymph, poc: 2.5 (ref 0.6–3.4)
MCH: 27.1 pg (ref 27–31.2)
MCHC: 32 g/dL (ref 31.8–35.4)
MCV: 84.6 fL (ref 80–97)
MID (CBC): 0.2 (ref 0–0.9)
MPV: 7.5 fL (ref 0–99.8)
PLATELET COUNT, POC: 425 10*3/uL — AB (ref 142–424)
POC GRANULOCYTE: 4.7 (ref 2–6.9)
POC LYMPH PERCENT: 33.6 %L (ref 10–50)
POC MID %: 2.6 % (ref 0–12)
RBC: 4.86 M/uL (ref 4.04–5.48)
RDW, POC: 14.2 %
WBC: 7.3 10*3/uL (ref 4.6–10.2)

## 2014-06-18 LAB — POCT UA - MICROSCOPIC ONLY
CASTS, UR, LPF, POC: NEGATIVE
Crystals, Ur, HPF, POC: NEGATIVE
Mucus, UA: NEGATIVE
Yeast, UA: NEGATIVE

## 2014-06-18 LAB — POCT URINALYSIS DIPSTICK
BILIRUBIN UA: NEGATIVE
Glucose, UA: NEGATIVE
Ketones, UA: NEGATIVE
Leukocytes, UA: NEGATIVE
NITRITE UA: POSITIVE
Protein, UA: 30
Spec Grav, UA: >=1.03
UROBILINOGEN UA: 0.2
pH, UA: 5.5

## 2014-06-18 MED ORDER — DICYCLOMINE HCL 10 MG PO CAPS: ORAL_CAPSULE | ORAL | Status: DC

## 2014-06-18 MED ORDER — CIPROFLOXACIN HCL 500 MG PO TABS: 500.0000 mg | ORAL_TABLET | Freq: Two times a day (BID) | ORAL | Status: DC

## 2014-06-18 MED ORDER — ONDANSETRON 4 MG PO TBDP
4.0000 mg | ORAL_TABLET | Freq: Once | ORAL | Status: AC
  Administered 2014-06-18: 4 mg via ORAL

## 2014-06-18 NOTE — Patient Instructions (Addendum)
Try to drink plenty of fluids  Use the Zofran and /or Promethazine for nausea or vomiting  Take dicyclomine (Bentyl) one before meals and at bedtime if needed for abdominal discomfort, diarrhea, or nausea.  Return if acutely worse  Take Cipro twice daily with food

## 2014-06-18 NOTE — Progress Notes (Signed)
Subjective: 28 year old lady who's been feeling bad actually since Friday. Is really been going on longer than that. She has a history of abdominal discomfort and nausea. She's [facility] for her symptoms which have been persistent. She is scheduled for an upper endoscopy in the near future, as well as getting some additional testing done for enteropathy's  This weekend she spent most of the time in bed. She has been nauseated and always feels like she's going to vomit, though the vomiting is just been intermittent. When she goes to the  bathroom she has watery stool.she had more pain in the abdomen earlier today, and it has subsided but she just continues to feel lousy. She has some chills though has not been documented with having any fever. She has not had any surgeries on her abdomen. She just had her menstrual cycle. She is single and not sexually involved.  She is hypothyroid and had a recent change in her medication dose from 175mcg to 200mcg and changed her endocrinologist.  Objective: Overweight but weight has been staying constant. Chest is clear. Heart regular without murmurs. Abdomen has active bowel sounds. Soft and nontender. Skin warm and dry.  Assessment: Nonspecific abdominal pain, nausea and vomiting, and diarrhea with generalized malaise hypothyroidism   Plan: Some workup is in progress by the gastroenterologist. Will check CBC and chemistries to make sure no evidence of anything acute. We will have to treat symptomatically as the workup is progressing. She had Zofran earlier today, but we will give her an additional dose now.  Advised her to take some Phenergan at home which she has. Will also prescribe some dicyclomine (Bentyl) for when necessary use to see if that will help calm the symptoms.  Results for orders placed or performed in visit on 06/18/14  POCT CBC  Result Value Ref Range   WBC 7.3 4.6 - 10.2 K/uL   Lymph, poc 2.5  0.6 - 3.4   POC LYMPH PERCENT 33.6 10 - 50 %L   MID (cbc) 0.2 0 - 0.9   POC MID % 2.6 0 - 12 %M   POC Granulocyte 4.7 2 - 6.9   Granulocyte percent 63.8 37 - 80 %G   RBC 4.86 4.04 - 5.48 M/uL   Hemoglobin 13.2 12.2 - 16.2 g/dL   HCT, POC 16.141.1 09.637.7 - 47.9 %   MCV 84.6 80 - 97 fL   MCH, POC 27.1 27 - 31.2 pg   MCHC 32.0 31.8 - 35.4 g/dL   RDW, POC 04.514.2 %   Platelet Count, POC 425 (A) 142 - 424 K/uL   MPV 7.5 0 - 99.8 fL  POCT UA - Microscopic Only  Result Value Ref Range   WBC, Ur, HPF, POC 1-4    RBC, urine, microscopic tntc    Bacteria, U Microscopic 3+    Mucus, UA neg    Epithelial cells, urine per micros 0-2    Crystals, Ur, HPF, POC neg    Casts, Ur, LPF, POC neg    Yeast, UA neg   POCT urinalysis dipstick  Result Value Ref Range   Color, UA yellow    Clarity, UA cloudy    Glucose, UA neg    Bilirubin, UA neg    Ketones, UA neg    Spec Grav, UA >=1.030    Blood, UA large    pH, UA 5.5    Protein, UA 30    Urobilinogen, UA 0.2  Nitrite, UA positive    Leukocytes, UA Negative    Possible UTI though not definite. Will treat with 3 days of Cipro.  If symptoms continue to persist despite the Bentyl and Cipro will check a gallbladder ultrasound on her.she does get intermittent pain in the right upper quadrant.

## 2014-06-19 LAB — COMPREHENSIVE METABOLIC PANEL
ALBUMIN: 4.5 g/dL (ref 3.5–5.2)
ALT: 31 U/L (ref 0–35)
AST: 24 U/L (ref 0–37)
Alkaline Phosphatase: 88 U/L (ref 39–117)
BUN: 13 mg/dL (ref 6–23)
CALCIUM: 9.8 mg/dL (ref 8.4–10.5)
CHLORIDE: 104 meq/L (ref 96–112)
CO2: 28 mEq/L (ref 19–32)
Creat: 0.89 mg/dL (ref 0.50–1.10)
Glucose, Bld: 93 mg/dL (ref 70–99)
Potassium: 4.2 mEq/L (ref 3.5–5.3)
SODIUM: 141 meq/L (ref 135–145)
TOTAL PROTEIN: 7.7 g/dL (ref 6.0–8.3)
Total Bilirubin: 0.3 mg/dL (ref 0.2–1.2)

## 2014-06-21 LAB — URINE CULTURE: Colony Count: >=100000

## 2014-08-12 ENCOUNTER — Encounter: Payer: Self-pay | Admitting: Skilled Nursing Facility1

## 2014-08-12 ENCOUNTER — Encounter: Payer: 59 | Attending: Gastroenterology | Admitting: Skilled Nursing Facility1

## 2014-08-12 VITALS — Ht 68.0 in | Wt 264.0 lb

## 2014-08-12 DIAGNOSIS — K900 Celiac disease: Secondary | ICD-10-CM | POA: Insufficient documentation

## 2014-08-12 DIAGNOSIS — Z713 Dietary counseling and surveillance: Secondary | ICD-10-CM | POA: Diagnosis not present

## 2014-08-12 NOTE — Progress Notes (Signed)
  Medical Nutrition Therapy:  Appt start time: 1130 end time:  1230.   Assessment:  Primary concerns today: referred for celiac disease. Patient wants to control her disease and determine the proper diet for treatment. The patient states she has had no formal education for celiac disease. She states she has had a 10 pound weight loss since the past couple of weeks. She has not been eating adequate calories due her lack of education in celiac disease.She states she has had GI upset for the last couple of years and also has a negative GI reaction to lactose products including cheese. She states she has had some brittle hair with loss and dry skin. She states she has severe diarrhea when she consumes sugar free products and whole fruits.   Preferred Learning Style:   No preference indicated   Learning Readiness:  Change in progress   MEDICATIONS: See List   DIETARY INTAKE:  Usual eating pattern includes 2 meals and 0 snacks per day.  Everyday foods include none stated.  Avoided foods include lactose and wheat products.    24-hr recall:  B ( AM): fruit and yogurt  Snk ( AM): none  L ( PM): salads-garden Snk ( PM): fruit or nuts D ( PM): none Snk ( PM): none Beverages: fruit juice, water, hot green tea  Usual physical activity: ADL's  Estimated energy needs: 2000 calories 225 g carbohydrates 150 g protein 56 g fat  Progress Towards Goal(s):  In progress.   Nutritional Diagnosis:  NB-1.1 Food and nutrition-related knowledge deficit As related to new diagnosis of celiac disease.  As evidenced by the pateint stating she did not know what to eat with her disease.    Intervention:  Nutrition Counseling for celiac disease. Dietitian educated the patient on what to eat and what not to eat in order to treat celiac disease. Dietitian also educated the patient on the disease in general as well as the malabsorption associated with it. Sugar and diarrhea was discussed as  well.  Goals:  -Eat proper portions and ensure you are eating fruits, vegetables, and protein daily and at every meal -Take your multivitamin every day -Always read the nutrition facts label  -Only use legitimate sources for your celiac education (.org, .gov, etc.) -Start a food and symptom journal  Teaching Method Utilized:  Visual Auditory   Handouts given during visit include:  Celiac nutrition from [facility]  Label reading for Celiac disease from [facility]  Celiac Tips from the [facility]  Barriers to learning/adherence to lifestyle change: none.  Demonstrated degree of understanding via:  Teach Back   Monitoring/Evaluation:  Dietary intake prn.

## 2014-09-26 ENCOUNTER — Encounter: Payer: Self-pay | Admitting: Internal Medicine

## 2014-09-26 ENCOUNTER — Ambulatory Visit (INDEPENDENT_AMBULATORY_CARE_PROVIDER_SITE_OTHER): Payer: 59 | Admitting: Internal Medicine

## 2014-09-26 VITALS — BP 132/87 | HR 76 | Temp 98.2°F | Ht 68.0 in | Wt 257.2 lb

## 2014-09-26 DIAGNOSIS — R002 Palpitations: Secondary | ICD-10-CM

## 2014-09-26 DIAGNOSIS — K900 Celiac disease: Secondary | ICD-10-CM

## 2014-09-26 DIAGNOSIS — E039 Hypothyroidism, unspecified: Secondary | ICD-10-CM

## 2014-09-26 DIAGNOSIS — G43109 Migraine with aura, not intractable, without status migrainosus: Secondary | ICD-10-CM

## 2014-09-26 HISTORY — DX: Celiac disease: K90.0

## 2014-09-26 NOTE — Progress Notes (Signed)
Pre visit review using our clinic review tool, if applicable. No additional management support is needed unless otherwise documented below in the visit note. 

## 2014-09-26 NOTE — Progress Notes (Signed)
Subjective:    Patient ID: Gwendolyn [name], female    DOB: 11/22/1985, 29 y.o.   MRN: [id number]  DOS:  09/26/2014 Type of visit - description : Follow-up from previous visit Interval history:  Thyroid disease, now under the care of Dr. Sharl Ma, most recent TSH was low, they are adjusting  her Synthroid dose Palpitations, still have on-off Had GI symptoms, eventually diagnosed with celiac disease, has changed her diet and symptoms are gradually improving.  Review of Systems Denies syncope, chest pain or difficulty breathing Headaches have improved significantly.   Past Medical History  Diagnosis Date  . Seasonal allergies     rhinitis  . Anemia     PMH of  . Hypothyroid     hypothyroidism  . Hyperlipidemia 07/20/2010    LDL 150, HDL 37.9  . Nonspecific elevation of levels of transaminase or lactic acid dehydrogenase (LDH) 07/20/2010    PMH of; ALT 47   . Abnormal pap     9/11Hx abn.pap/colpo--bx neg, 3/14 ASCUS +HPV no colpo due to age  . Migraine headache with aura 03/13/2013    See 03/13/13 throbbing left frontal headache with radiation to the occiput. Possible prodrome of flushing. Oral and blurred vision and photosensitivity.  Family history of migraines in her mother   . Wrist fracture, right at age 89    no surgery  . Hashimoto's thyroiditis   . Celiac disease 09/26/2014    Past Surgical History  Procedure Laterality Date  . Wisdom tooth extraction    . Knee arthroscopy  2012    L; Dr Shelle Iron  . Colposcopy      2011 neg    History   Social History  . Marital Status: Single    Spouse Name: N/A  . Number of Children: 0  . Years of Education: N/A   Occupational History  . 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 Smoking status: Never Smoker   . Smokeless tobacco: Never Used  . Alcohol Use: No     Comment: very rare  . Drug Use: No  . Sexual Activity:    Partners: Male    Birth Control/ Protection: Condom   Other Topics Concern  . Not on file    Social History Narrative   Lives w/ parents    Going back to school         Medication List       This list is accurate as of: 09/26/14  6:34 PM.  Always use your most recent med list.               dicyclomine 10 MG capsule  Commonly known as:  BENTYL  Take 1 before meals and at bedtime as necessary for nausea, vomiting, and diarrhea     EXCEDRIN ASPIRIN FREE PO  Take by mouth.     levothyroxine 200 MCG tablet  Commonly known as:  SYNTHROID, LEVOTHROID  Take 200 mcg by mouth daily before breakfast.     multivitamin tablet  Take 1 tablet by mouth daily.     multivitamin with minerals tablet  Take 1 tablet by mouth daily.     ondansetron 4 MG disintegrating tablet  Commonly known as:  ZOFRAN-ODT  Take 4 mg by mouth every 8 (eight) hours as needed for nausea or vomiting.     PROBIOTIC DAILY PO  Take 1 tablet by mouth daily.     promethazine 25 MG tablet  Commonly known as:  PHENERGAN  Take 1 tablet (25 mg total) by mouth every 8 (eight) hours as needed for nausea or vomiting.     rizatriptan 5 MG tablet  Commonly known as:  MAXALT  Take 1 tablet (5 mg total) by mouth as needed for migraine. May repeat in 2 hours if needed           Objective:   Physical Exam BP 132/87 mmHg  Pulse 76  Temp(Src) 98.2 F (36.8 C) (Oral)  Ht 5\' 8"  (1.727 m)  Wt 257 lb 4 oz (116.688 kg)  BMI 39.12 kg/m2  SpO2 98%  LMP 09/05/2014 (Approximate) General:   Well developed, well nourished . NAD.  HEENT:  Normocephalic . Face symmetric, atraumatic. No thyromegaly Lungs:  CTA B Normal respiratory effort, no intercostal retractions, no accessory muscle use. Heart: RRR,  no murmur.  Muscle skeletal: no pretibial edema bilaterally  Skin: Not pale. Not jaundice Neurologic:  alert & oriented X3.  Speech normal, gait appropriate for age and unassisted Psych--  Cognition and judgment appear intact.  Cooperative with normal attention span and concentration.  Behavior  appropriate. No anxious or depressed appearing.       Assessment & Plan:

## 2014-09-26 NOTE — Assessment & Plan Note (Signed)
Recently diagnosed with celiac disease elsewhere, modify her diet, had GI symptoms, they are gradually improving.

## 2014-09-26 NOTE — Assessment & Plan Note (Signed)
Still having on and off symptoms, Holter was negative few months ago. We agreed to wait until her thyroid level is corrected and if she is still have symptoms we'll proceed with further eval.

## 2014-09-26 NOTE — Assessment & Plan Note (Signed)
On the care of both Dr. Sharl MaKerr, last TSH low, they are adjusting her Synthroid dose

## 2014-09-26 NOTE — Patient Instructions (Signed)
Please come back to the office by may or June 2016 for a physical exam. Come back fasting

## 2014-09-26 NOTE — Assessment & Plan Note (Signed)
At this point, problem is essentially silent

## 2014-10-21 ENCOUNTER — Encounter: Payer: Self-pay | Admitting: Internal Medicine

## 2014-10-21 ENCOUNTER — Ambulatory Visit (INDEPENDENT_AMBULATORY_CARE_PROVIDER_SITE_OTHER): Payer: 59 | Admitting: Internal Medicine

## 2014-10-21 VITALS — BP 122/78 | HR 75 | Temp 98.2°F | Ht 68.0 in | Wt 252.2 lb

## 2014-10-21 DIAGNOSIS — G43109 Migraine with aura, not intractable, without status migrainosus: Secondary | ICD-10-CM

## 2014-10-21 DIAGNOSIS — F32A Depression, unspecified: Secondary | ICD-10-CM | POA: Insufficient documentation

## 2014-10-21 DIAGNOSIS — E039 Hypothyroidism, unspecified: Secondary | ICD-10-CM

## 2014-10-21 DIAGNOSIS — F329 Major depressive disorder, single episode, unspecified: Secondary | ICD-10-CM

## 2014-10-21 LAB — TSH: TSH: 0.44 u[IU]/mL (ref 0.35–4.50)

## 2014-10-21 MED ORDER — ZOLPIDEM TARTRATE 10 MG PO TABS: 10.0000 mg | ORAL_TABLET | Freq: Every evening | ORAL | Status: DC | PRN

## 2014-10-21 NOTE — Assessment & Plan Note (Signed)
Due for a TSH check

## 2014-10-21 NOTE — Progress Notes (Signed)
Pre visit review using our clinic review tool, if applicable. No additional management support is needed unless otherwise documented below in the visit note. 

## 2014-10-21 NOTE — Patient Instructions (Signed)
Get your blood work before you leave   Please see one of our counselors  Come back to the office  In 2 months  for a routine check up

## 2014-10-21 NOTE — Assessment & Plan Note (Signed)
Headaches, relatively well controlled, not needing abortive medication lately

## 2014-10-21 NOTE — Progress Notes (Signed)
Subjective:    Patient ID: Gwendolyn [name], female    DOB: Nov 10, 1985, 29 y.o.   MRN: [id number]  DOS:  10/21/2014 Type of visit - description : acute Interval history: Patient works full-time and is a Physicist, medicalfull-time student, feels that she is depressed. Reports that over the last few months is feeling depressed, more anxious mostly at work, inpatient, somewhat withdrawn from friends,  "not happy". Also has noted a inability to focus, not doing as well in school as she would like to. She also reports that has not sleep well for long time, in the past she was recommended to see pulmonary for possible sleep apnea study but did not pursue that. Denies snoring. Stress has increased lately, her mother apparently will not  have a job in the next few weeks. Denies suicidal ideas.    Review of Systems See HPI  Past Medical History  Diagnosis Date  . Seasonal allergies     rhinitis  . Anemia     PMH of  . Hypothyroid     hypothyroidism  . Hyperlipidemia 07/20/2010    LDL 150, HDL 37.9  . Nonspecific elevation of levels of transaminase or lactic acid dehydrogenase (LDH) 07/20/2010    PMH of; ALT 47   . Abnormal pap     9/11Hx abn.pap/colpo--bx neg, 3/14 ASCUS +HPV no colpo due to age  . Migraine headache with aura 03/13/2013    See 03/13/13 throbbing left frontal headache with radiation to the occiput. Possible prodrome of flushing. Oral and blurred vision and photosensitivity.  Family history of migraines in her mother   . Wrist fracture, right at age 89    no surgery  . Hashimoto's thyroiditis   . Celiac disease 09/26/2014    Past Surgical History  Procedure Laterality Date  . Wisdom tooth extraction    . Knee arthroscopy  2012    L; Dr Shelle IronBeane  . Colposcopy      2011 neg    History   Social History  . Marital Status: Single    Spouse Name: N/A  . Number of Children: 0  . Years of Education: N/A   Occupational History  . 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  Smoking status: Never Smoker   . Smokeless tobacco: Never Used  . Alcohol Use: No     Comment: very rare  . Drug Use: No  . Sexual Activity:    Partners: Male    Birth Control/ Protection: Condom   Other Topics Concern  . Not on file   Social History Narrative   Lives w/ parents    Going back to school         Medication List       This list is accurate as of: 10/21/14  6:00 PM.  Always use your most recent med list.               dicyclomine 10 MG capsule  Commonly known as:  BENTYL  Take 1 before meals and at bedtime as necessary for nausea, vomiting, and diarrhea     EXCEDRIN ASPIRIN FREE PO  Take by mouth.     levothyroxine 200 MCG tablet  Commonly known as:  SYNTHROID, LEVOTHROID  Take 200 mcg by mouth daily before breakfast.     multivitamin with minerals tablet  Take 1 tablet by mouth daily.     ondansetron 4 MG disintegrating tablet  Commonly known as:  ZOFRAN-ODT  Take  4 mg by mouth every 8 (eight) hours as needed for nausea or vomiting.     PROBIOTIC DAILY PO  Take 1 tablet by mouth daily.     promethazine 25 MG tablet  Commonly known as:  PHENERGAN  Take 1 tablet (25 mg total) by mouth every 8 (eight) hours as needed for nausea or vomiting.     rizatriptan 5 MG tablet  Commonly known as:  MAXALT  Take 1 tablet (5 mg total) by mouth as needed for migraine. May repeat in 2 hours if needed     zolpidem 10 MG tablet  Commonly known as:  AMBIEN  Take 1 tablet (10 mg total) by mouth at bedtime as needed for sleep.           Objective:   Physical Exam BP 122/78 mmHg  Pulse 75  Temp(Src) 98.2 F (36.8 C) (Oral)  Ht  (1.727 m)  Wt 252 lb 4 oz (114.42 kg)  BMI 38.36 kg/m2  SpO2 98%  LMP 07/28/2014 General:   Well developed, well nourished . NAD.  HEENT:  Normocephalic . Face symmetric, atraumatic; thyroid not enlarged or tender   Skin: Not pale. Not jaundice Neurologic:  alert & oriented X3.  Speech normal, gait appropriate for  age and unassisted Psych--  Cognition and judgment appear intact.  Cooperative with normal attention span and concentration.  Behavior appropriate. No anxious but slt  depressed appearing.        Assessment & Plan:  Epworth sleepiness scale score 2 which is negative PHQ/9 she scored 11, moderate depression, "somewhat difficult" to function

## 2014-10-21 NOTE — Assessment & Plan Note (Signed)
Depression, Patient presents with chief complaint of depression, exam is confirmatory. She is not suicidal. She also is lacking focus lately (once the patient is improved will reassess the situation, ADD?). Different treatment modalities discussed include psychotherapy, medication. At this point she likes to start with psychotherapy, list of our  a counselors was provided. Also has problems with insomnia, should improve with treatment of depression but at this point she needs help, we agreed to try Ambien. RTC 2 months, call sooner if needed

## 2014-10-26 ENCOUNTER — Encounter: Payer: Self-pay | Admitting: Internal Medicine

## 2014-11-11 ENCOUNTER — Ambulatory Visit (INDEPENDENT_AMBULATORY_CARE_PROVIDER_SITE_OTHER): Payer: 59 | Admitting: Psychology

## 2014-11-11 DIAGNOSIS — F4323 Adjustment disorder with mixed anxiety and depressed mood: Secondary | ICD-10-CM

## 2014-11-12 ENCOUNTER — Ambulatory Visit (INDEPENDENT_AMBULATORY_CARE_PROVIDER_SITE_OTHER): Payer: 59 | Admitting: Certified Nurse Midwife

## 2014-11-12 ENCOUNTER — Encounter: Payer: Self-pay | Admitting: Certified Nurse Midwife

## 2014-11-12 VITALS — BP 110/70 | HR 70 | Resp 16 | Ht 68.0 in | Wt 254.0 lb

## 2014-11-12 DIAGNOSIS — Z01419 Encounter for gynecological examination (general) (routine) without abnormal findings: Secondary | ICD-10-CM

## 2014-11-12 DIAGNOSIS — Z0000 Encounter for general adult medical examination without abnormal findings: Secondary | ICD-10-CM | POA: Diagnosis not present

## 2014-11-12 NOTE — Patient Instructions (Signed)
General topics  Next pap or exam is  due in 1 year Take a Women's multivitamin Take 1200 mg. of calcium daily - prefer dietary If any concerns in interim to call back  Breast Self-Awareness Practicing breast self-awareness may pick up problems early, prevent significant medical complications, and possibly save your life. By practicing breast self-awareness, you can become familiar with how your breasts look and feel and if your breasts are changing. This allows you to notice changes early. It can also offer you some reassurance that your breast health is good. One way to learn what is normal for your breasts and whether your breasts are changing is to do a breast self-exam. If you find a lump or something that was not present in the past, it is best to contact your caregiver right away. Other findings that should be evaluated by your caregiver include nipple discharge, especially if it is bloody; skin changes or reddening; areas where the skin seems to be pulled in (retracted); or new lumps and bumps. Breast pain is seldom associated with cancer (malignancy), but should also be evaluated by a caregiver. BREAST SELF-EXAM The best time to examine your breasts is 5 7 days after your menstrual period is over.  ExitCare Patient Information 2013 [employer].   Exercise to Stay Healthy Exercise helps you become and stay healthy. EXERCISE IDEAS AND TIPS Choose exercises that:  You enjoy.  Fit into your day. You do not need to exercise really hard to be healthy. You can do exercises at a slow or medium level and stay healthy. You can:  Stretch before and after working out.  Try yoga, Pilates, or tai chi.  Lift weights.  Walk fast, swim, jog, run, climb stairs, bicycle, dance, or rollerskate.  Take aerobic classes. Exercises that burn about 150 calories:  Running 1  miles in 15 minutes.  Playing volleyball for 45 to 60 minutes.  Washing and waxing a car for 45 to 60  minutes.  Playing touch football for 45 minutes.  Walking 1  miles in 35 minutes.  Pushing a stroller 1  miles in 30 minutes.  Playing basketball for 30 minutes.  Raking leaves for 30 minutes.  Bicycling 5 miles in 30 minutes.  Walking 2 miles in 30 minutes.  Dancing for 30 minutes.  Shoveling snow for 15 minutes.  Swimming laps for 20 minutes.  Walking up stairs for 15 minutes.  Bicycling 4 miles in 15 minutes.  Gardening for 30 to 45 minutes.  Jumping rope for 15 minutes.  Washing windows or floors for 45 to 60 minutes. Document Released: 08/28/2010 Document Revised: 10/18/2011 Document Reviewed: 08/28/2010 ExitCare Patient Information 2013 [employer].   Other topics ( that may be useful information):    Sexually Transmitted Disease Sexually transmitted disease (STD) refers to any infection that is passed from person to person during sexual activity. This may happen by way of saliva, semen, blood, vaginal mucus, or urine. Common STDs include:  Gonorrhea.  Chlamydia.  Syphilis.  HIV/AIDS.  Genital herpes.  Hepatitis B and C.  Trichomonas.  Human papillomavirus (HPV).  Pubic lice. CAUSES  An STD may be spread by bacteria, virus, or parasite. A person can get an STD by:  Sexual intercourse with an infected person.  Sharing sex toys with an infected person.  Sharing needles with an infected person.  Having intimate contact with the genitals, mouth, or rectal areas of an infected person. SYMPTOMS  Some people may not have any symptoms, but   they can still pass the infection to others. Different STDs have different symptoms. Symptoms include:  Painful or bloody urination.  Pain in the pelvis, abdomen, vagina, anus, throat, or eyes.  Skin rash, itching, irritation, growths, or sores (lesions). These usually occur in the genital or anal area.  Abnormal vaginal discharge.  Penile discharge in men.  Soft, flesh-colored skin growths in the  genital or anal area.  Fever.  Pain or bleeding during sexual intercourse.  Swollen glands in the groin area.  Yellow skin and eyes (jaundice). This is seen with hepatitis. DIAGNOSIS  To make a diagnosis, your caregiver may:  Take a medical history.  Perform a physical exam.  Take a specimen (culture) to be examined.  Examine a sample of discharge under a microscope.  Perform blood test TREATMENT   Chlamydia, gonorrhea, trichomonas, and syphilis can be cured with antibiotic medicine.  Genital herpes, hepatitis, and HIV can be treated, but not cured, with prescribed medicines. The medicines will lessen the symptoms.  Genital warts from HPV can be treated with medicine or by freezing, burning (electrocautery), or surgery. Warts may come back.  HPV is a virus and cannot be cured with medicine or surgery.However, abnormal areas may be followed very closely by your caregiver and may be removed from the cervix, vagina, or vulva through office procedures or surgery. If your diagnosis is confirmed, your recent sexual partners need treatment. This is true even if they are symptom-free or have a negative culture or evaluation. They should not have sex until their caregiver says it is okay. HOME CARE INSTRUCTIONS  All sexual partners should be informed, tested, and treated for all STDs.  Take your antibiotics as directed. Finish them even if you start to feel better.  Only take over-the-counter or prescription medicines for pain, discomfort, or fever as directed by your caregiver.  Rest.  Eat a balanced diet and drink enough fluids to keep your urine clear or pale yellow.  Do not have sex until treatment is completed and you have followed up with your caregiver. STDs should be checked after treatment.  Keep all follow-up appointments, Pap tests, and blood tests as directed by your caregiver.  Only use latex condoms and water-soluble lubricants during sexual activity. Do not use  petroleum jelly or oils.  Avoid alcohol and illegal drugs.  Get vaccinated for HPV and hepatitis. If you have not received these vaccines in the past, talk to your caregiver about whether one or both might be right for you.  Avoid risky sex practices that can break the skin. The only way to avoid getting an STD is to avoid all sexual activity.Latex condoms and dental dams (for oral sex) will help lessen the risk of getting an STD, but will not completely eliminate the risk. SEEK MEDICAL CARE IF:   You have a fever.  You have any new or worsening symptoms. Document Released: 10/16/2002 Document Revised: 10/18/2011 Document Reviewed: 10/23/2010 [facility] Patient Information 2013 [place].    Domestic Abuse You are being battered or abused if someone close to you hits, pushes, or physically hurts you in any way. You also are being abused if you are forced into activities. You are being sexually abused if you are forced to have sexual contact of any kind. You are being emotionally abused if you are made to feel worthless or if you are constantly threatened. It is important to remember that help is available. No one has the right to abuse you. PREVENTION OF FURTHER  ABUSE  Learn the warning signs of danger. This varies with situations but may include: the use of alcohol, threats, isolation from friends and family, or forced sexual contact. Leave if you feel that violence is going to occur.  If you are attacked or beaten, report it to the police so the abuse is documented. You do not have to press charges. The police can protect you while you or the attackers are leaving. Get the officer's name and badge number and a copy of the report.  Find someone you can trust and tell them what is happening to you: your caregiver, a nurse, clergy member, close friend or family member. Feeling ashamed is natural, but remember that you have done nothing wrong. No one deserves abuse. Document Released:  07/23/2000 Document Revised: 10/18/2011 Document Reviewed: 10/01/2010 ExitCare Patient Information 2013 Exit[employer].    How Much is Too Much Alcohol? Drinking too much alcohol can cause injury, accidents, and health problems. These types of problems can include:   Car crashes.  Falls.  Family fighting (domestic violence).  Drowning.  Fights.  Injuries.  Burns.  Damage to certain organs.  Having a baby with birth defects. ONE DRINK CAN BE TOO MUCH WHEN YOU ARE:  Working.  Pregnant or breastfeeding.  Taking medicines. Ask your doctor.  Driving or planning to drive. If you or someone you know has a drinking problem, get help from a doctor.  Document Released: 05/22/2009 Document Revised: 10/18/2011 Document Reviewed: 05/22/2009 ExitCare Patient Information 2013 Exit[employer].   Smoking Hazards Smoking cigarettes is extremely bad for your health. Tobacco smoke has over 200 known poisons in it. There are over 60 chemicals in tobacco smoke that cause cancer. Some of the chemicals found in cigarette smoke include:   Cyanide.  Benzene.  Formaldehyde.  Methanol (wood alcohol).  Acetylene (fuel used in welding torches).  Ammonia. Cigarette smoke also contains the poisonous gases nitrogen oxide and carbon monoxide.  Cigarette smokers have an increased risk of many serious medical problems and Smoking causes approximately:  90% of all lung cancer deaths in men.  80% of all lung cancer deaths in women.  90% of deaths from chronic obstructive lung disease. Compared with nonsmokers, smoking increases the risk of:  Coronary heart disease by 2 to 4 times.  Stroke by 2 to 4 times.  Men developing lung cancer by 23 times.  Women developing lung cancer by 13 times.  Dying from chronic obstructive lung diseases by 12 times.  . Smoking is the most preventable cause of death and disease in our society.  WHY IS SMOKING ADDICTIVE?  Nicotine is the chemical  agent in tobacco that is capable of causing addiction or dependence.  When you smoke and inhale, nicotine is absorbed rapidly into the bloodstream through your lungs. Nicotine absorbed through the lungs is capable of creating a powerful addiction. Both inhaled and non-inhaled nicotine may be addictive.  Addiction studies of cigarettes and spit tobacco show that addiction to nicotine occurs mainly during the teen years, when young people begin using tobacco products. WHAT ARE THE BENEFITS OF QUITTING?  There are many health benefits to quitting smoking.   Likelihood of developing cancer and heart disease decreases. Health improvements are seen almost immediately.  Blood pressure, pulse rate, and breathing patterns start returning to normal soon after quitting. QUITTING SMOKING   American Lung Association - 1-800-LUNGUSA  American Cancer Society - 1-800-ACS-2345 Document Released: 09/02/2004 Document Revised: 10/18/2011 Document Reviewed: 05/07/2009 ExitCare Patient Information 2013 ExitCare,   LLC.   Stress Management Stress is a state of physical or mental tension that often results from changes in your life or normal routine. Some common causes of stress are:  Death of a loved one.  Injuries or severe illnesses.  Getting fired or changing jobs.  Moving into a new home. Other causes may be:  Sexual problems.  Business or financial losses.  Taking on a large debt.  Regular conflict with someone at home or at work.  Constant tiredness from lack of sleep. It is not just bad things that are stressful. It may be stressful to:  Win the lottery.  Get married.  Buy a new car. The amount of stress that can be easily tolerated varies from person to person. Changes generally cause stress, regardless of the types of change. Too much stress can affect your health. It may lead to physical or emotional problems. Too little stress (boredom) may also become stressful. SUGGESTIONS TO  REDUCE STRESS:  Talk things over with your family and friends. It often is helpful to share your concerns and worries. If you feel your problem is serious, you may want to get help from a professional counselor.  Consider your problems one at a time instead of lumping them all together. Trying to take care of everything at once may seem impossible. List all the things you need to do and then start with the most important one. Set a goal to accomplish 2 or 3 things each day. If you expect to do too many in a single day you will naturally fail, causing you to feel even more stressed.  Do not use alcohol or drugs to relieve stress. Although you may feel better for a short time, they do not remove the problems that caused the stress. They can also be habit forming.  Exercise regularly - at least 3 times per week. Physical exercise can help to relieve that "uptight" feeling and will relax you.  The shortest distance between despair and hope is often a good night's sleep.  Go to bed and get up on time allowing yourself time for appointments without being rushed.  Take a short "time-out" period from any stressful situation that occurs during the day. Close your eyes and take some deep breaths. Starting with the muscles in your face, tense them, hold it for a few seconds, then relax. Repeat this with the muscles in your neck, shoulders, hand, stomach, back and legs.  Take good care of yourself. Eat a balanced diet and get plenty of rest.  Schedule time for having fun. Take a break from your daily routine to relax. HOME CARE INSTRUCTIONS   Call if you feel overwhelmed by your problems and feel you can no longer manage them on your own.  Return immediately if you feel like hurting yourself or someone else. Document Released: 01/19/2001 Document Revised: 10/18/2011 Document Reviewed: 09/11/2007 [facility] Patient Information 2013 [place].  Exercise to Lose Weight Exercise and a healthy diet  may help you lose weight. Your doctor may suggest specific exercises. EXERCISE IDEAS AND TIPS  Choose low-cost things you enjoy doing, such as walking, bicycling, or exercising to workout videos.  Take stairs instead of the elevator.  Walk during your lunch break.  Park your car further away from work or school.  Go to a gym or an exercise class.  Start with 5 to 10 minutes of exercise each day. Build up to 30 minutes of exercise 4 to 6 days a week.  Wear shoes with good support and comfortable clothes.  Stretch before and after working out.  Work out until you breathe harder and your heart beats faster.  Drink extra water when you exercise.  Do not do so much that you hurt yourself, feel dizzy, or get very short of breath. Exercises that burn about 150 calories:  Running 1  miles in 15 minutes.  Playing volleyball for 45 to 60 minutes.  Washing and waxing a car for 45 to 60 minutes.  Playing touch football for 45 minutes.  Walking 1  miles in 35 minutes.  Pushing a stroller 1  miles in 30 minutes.  Playing basketball for 30 minutes.  Raking leaves for 30 minutes.  Bicycling 5 miles in 30 minutes.  Walking 2 miles in 30 minutes.  Dancing for 30 minutes.  Shoveling snow for 15 minutes.  Swimming laps for 20 minutes.  Walking up stairs for 15 minutes.  Bicycling 4 miles in 15 minutes.  Gardening for 30 to 45 minutes.  Jumping rope for 15 minutes.  Washing windows or floors for 45 to 60 minutes. Document Released: 08/28/2010 Document Revised: 10/18/2011 Document Reviewed: 08/28/2010 [facility] Patient Information 2015 [place], [place]. This information is not intended to replace advice given to you by your health care provider. Make sure you discuss any questions you have with your health care provider.

## 2014-11-12 NOTE — Progress Notes (Signed)
Reviewed personally.  M. Suzanne Yerick Eggebrecht, MD.  

## 2014-11-12 NOTE — Progress Notes (Signed)
29 y.o. G0P0000 Single  Caucasian Fe here for annual exam.  Periods normal, no issues. Contraception working well. Continues to see Endocrine for thyroid regulation, recent change in medication.  Diagnosed with Celiac disease in 11/15, working on dietary changes with good results. Desires STD screening. No partner change. Sees Dr Thomasenia Sales yearly with labs.  No other health concerns today.  Patient's last menstrual period was 10/28/2014.          Sexually active: Yes.    The current method of family planning is condoms all the time.    Exercising: Yes.    running & walking Smoker:  no  Health Maintenance: Pap:  11-06-13 neg MMG:  none Colonoscopy:  None, endoscopy 11/15 BMD:   none TDaP:  2009 Labs: none Self breast exam: done occ   reports that she has never smoked. She has never used smokeless tobacco. She reports that she does not drink alcohol or use illicit drugs.  Past Medical History  Diagnosis Date  . Seasonal allergies     rhinitis  . Anemia     PMH of  . Hypothyroid     hypothyroidism  . Hyperlipidemia 07/20/2010    LDL 150, HDL 37.9  . Nonspecific elevation of levels of transaminase or lactic acid dehydrogenase (LDH) 07/20/2010    PMH of; ALT 47   . Abnormal pap     9/11Hx abn.pap/colpo--bx neg, 3/14 ASCUS +HPV no colpo due to age  . Migraine headache with aura 03/13/2013    See 03/13/13 throbbing left frontal headache with radiation to the occiput. Possible prodrome of flushing. Oral and blurred vision and photosensitivity.  Family history of migraines in her mother   . Wrist fracture, right at age 31    no surgery  . Hashimoto's thyroiditis   . Celiac disease 09/26/2014  . Depression     Past Surgical History  Procedure Laterality Date  . Wisdom tooth extraction    . Knee arthroscopy  2012    L; Dr Shelle Iron  . Colposcopy      2011 neg    Current Outpatient Prescriptions  Medication Sig Dispense Refill  . Acetaminophen-Caffeine (EXCEDRIN ASPIRIN FREE PO) Take by  mouth.    . dicyclomine (BENTYL) 10 MG capsule Take 1 before meals and at bedtime as necessary for nausea, vomiting, and diarrhea 30 capsule 1  . levothyroxine (SYNTHROID, LEVOTHROID) 200 MCG tablet Take 200 mcg by mouth daily before breakfast.    . Multiple Vitamins-Minerals (MULTIVITAMIN WITH MINERALS) tablet Take 1 tablet by mouth daily.    . ondansetron (ZOFRAN-ODT) 4 MG disintegrating tablet Take 4 mg by mouth every 8 (eight) hours as needed for nausea or vomiting.    . Probiotic Product (PROBIOTIC DAILY PO) Take 1 tablet by mouth daily.     . promethazine (PHENERGAN) 25 MG tablet Take 1 tablet (25 mg total) by mouth every 8 (eight) hours as needed for nausea or vomiting. 20 tablet 0  . zolpidem (AMBIEN) 10 MG tablet Take 1 tablet (10 mg total) by mouth at bedtime as needed for sleep. 30 tablet 0   No current facility-administered medications for this visit.    Family History  Problem Relation Age of Onset  . Depression Mother     ? onset in mid 59s  . Hypertension Mother   . Asthma Father   . Diabetes Father   . Thyroid nodules Father     S/P thyroidectomy  . Heart disease Father     S/P defibrillator  .  Cancer Maternal Grandmother     ?? female cancer  . Cancer Maternal Grandfather     esophageal  . Thyroid nodules Paternal Grandmother   . Colon cancer Neg Hx   . Breast cancer Neg Hx     ROS:  Pertinent items are noted in HPI.  Otherwise, a comprehensive ROS was negative.  Exam:   BP 110/70 mmHg  Pulse 70  Resp 16  Ht 5\' 8"  (1.727 m)  Wt 254 lb (115.214 kg)  BMI 38.63 kg/m2  LMP 10/28/2014 Height: 5\' 8"  (172.7 cm) Ht Readings from Last 3 Encounters:  11/12/14 5\' 8"  (1.727 m)  10/21/14 5\' 8"  (1.727 m)  09/26/14 5\' 8"  (1.727 m)    General appearance: alert, cooperative and appears stated age Head: Normocephalic, without obvious abnormality, atraumatic Neck: no adenopathy, supple, symmetrical, trachea midline and thyroid normal to inspection and palpation Lungs:  clear to auscultation bilaterally Breasts: normal appearance, no masses or tenderness, No nipple retraction or dimpling, No nipple discharge or bleeding, No axillary or supraclavicular adenopathy Heart: regular rate and rhythm Abdomen: soft, non-tender; no masses,  no organomegaly Extremities: extremities normal, atraumatic, no cyanosis or edema Skin: Skin color, texture, turgor normal. No rashes or lesions Lymph nodes: Cervical, supraclavicular, and axillary nodes normal. No abnormal inguinal nodes palpated Neurologic: Grossly normal   Pelvic: External genitalia:  no lesions              Urethra:  normal appearing urethra with no masses, tenderness or lesions              Bartholin's and Skene's: normal                 Vagina: normal appearing vagina with normal color and discharge, no lesions              Cervix: normal, non tender, no lesions              Pap taken: No. Bimanual Exam:  Uterus:  normal size, contour, position, consistency, mobility, non-tender              Adnexa: normal adnexa and no mass, fullness, tenderness               Rectovaginal: Confirms               Anus:  Normal appearance  Chaperone present: Yes  A:  Well Woman with normal exam  Contraception condoms  Hypothyroid on unstable medication with endocrine management  Newly diagnoses Celiac disease under follow up with GI  Morbid obesity  STD screening    P:   Reviewed health and wellness pertinent to exam  Still [facility], but can not proceed at this point. Will call when ready for consult with MD for insertion.  Continue follow up as indicated with MD  Discussed importance of exercise in weight control and encouraged some form of exercise daily  Labs: Gc,Chlamydia, STD panel, HSV 1,2  Pap smear not taken today   counseled on breast self exam, adequate intake of calcium and vitamin D, diet and exercise  return annually or prn  An After Visit Summary was printed and given to the  patient.

## 2014-11-13 LAB — STD PANEL
HIV 1&2 Ab, 4th Generation: NONREACTIVE
Hepatitis B Surface Ag: NEGATIVE

## 2014-11-13 LAB — TSH: TSH: 2.13 u[IU]/mL (ref <=5.90)

## 2014-11-13 LAB — HSV(HERPES SIMPLEX VRS) I + II AB-IGG
HSV 1 Glycoprotein G Ab, IgG: <0.1 IV
HSV 2 Glycoprotein G Ab, IgG: <0.1 IV

## 2014-11-14 LAB — IPS N GONORRHOEA AND CHLAMYDIA BY PCR

## 2014-11-22 ENCOUNTER — Ambulatory Visit (INDEPENDENT_AMBULATORY_CARE_PROVIDER_SITE_OTHER): Payer: 59 | Admitting: Family Medicine

## 2014-11-22 VITALS — BP 130/90 | HR 97 | Temp 98.2°F | Resp 16

## 2014-11-22 DIAGNOSIS — F439 Reaction to severe stress, unspecified: Secondary | ICD-10-CM

## 2014-11-22 DIAGNOSIS — M542 Cervicalgia: Secondary | ICD-10-CM | POA: Diagnosis not present

## 2014-11-22 DIAGNOSIS — M62838 Other muscle spasm: Secondary | ICD-10-CM

## 2014-11-22 DIAGNOSIS — Z658 Other specified problems related to psychosocial circumstances: Secondary | ICD-10-CM

## 2014-11-22 DIAGNOSIS — M25512 Pain in left shoulder: Secondary | ICD-10-CM | POA: Diagnosis not present

## 2014-11-22 DIAGNOSIS — M6248 Contracture of muscle, other site: Secondary | ICD-10-CM

## 2014-11-22 MED ORDER — CYCLOBENZAPRINE HCL 5 MG PO TABS: ORAL_TABLET | ORAL | Status: DC

## 2014-11-22 MED ORDER — MELOXICAM 7.5 MG PO TABS: 7.5000 mg | ORAL_TABLET | Freq: Every day | ORAL | Status: DC

## 2014-11-22 NOTE — Patient Instructions (Signed)
You likely have a sprained ligament or strained muscle in the left neck, which can lead to some muscle spasm as well. Try the mobic each morning (do not combine with other over the counter pain relievers), flexeril at night if needed.  Heat or ice to area as needed and the other treatments and exercises in the back care manual as tolerated. Let me know how this is doing into next wee.  If not improving into next week let me know. Sooner if worse.

## 2014-11-22 NOTE — Progress Notes (Signed)
Subjective:    Patient ID: Gwendolyn [name], female    DOB: 1986-05-24, 29 y.o.   MRN: [id number]  HPI Gwendolyn [name] is a 29 y.o. female   PCP: Willow Ora, MD   Here with complaints of L shoulder pain, upper back and L neck pain. Noticed 6 days ago after helping mom move some objects the day prior.  Noticed soreness in upper shoulder blade the next day. Tried some ibuprofen -  once - min to no relief. Heat helps temporarily. Pain has persisted since 6 days ago. No weakness in arm.  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   L hand dominant.   No prior L arm, neck or shoulder issues.   Patient Active Problem List   Diagnosis Date Noted  . Depression 10/21/2014  . Celiac disease 09/26/2014  . Annual physical exam 12/21/2013  . Palpitations 12/21/2013  . Migraine headache with aura 03/13/2013  . Unspecified vitamin D deficiency 12/19/2012  . Obesity, unspecified 11/02/2012  . HYPERLIPIDEMIA 07/20/2010  . Hypothyroidism 06/18/2009  . ANEMIA-IRON DEFICIENCY 05/16/2009  . Seasonal allergies 05/16/2009  . SLEEP DISORDER 05/16/2009   Past Medical History  Diagnosis Date  . Seasonal allergies     rhinitis  . Anemia     PMH of  . Hypothyroid     hypothyroidism  . Hyperlipidemia 07/20/2010    LDL 150, HDL 37.9  . Nonspecific elevation of levels of transaminase or lactic acid dehydrogenase (LDH) 07/20/2010    PMH of; ALT 47   . Abnormal pap     9/11Hx abn.pap/colpo--bx neg, 3/14 ASCUS +HPV no colpo due to age  . Migraine headache with aura 03/13/2013    See 03/13/13 throbbing left frontal headache with radiation to the occiput. Possible prodrome of flushing. Oral and blurred vision and photosensitivity.  Family history of migraines in her mother   . Wrist fracture, right at age 89    no surgery  . Hashimoto's thyroiditis   . Celiac disease 09/26/2014  . Depression    Past Surgical History  Procedure Laterality Date  . Wisdom tooth extraction    .  Knee arthroscopy  2012    L; Dr Shelle Iron  . Colposcopy      2011 neg   Allergies  Allergen Reactions  . Gluten Meal    Prior to Admission medications   Medication Sig Start Date End Date Taking? Authorizing Provider  Acetaminophen-Caffeine (EXCEDRIN ASPIRIN FREE PO) Take by mouth.   Yes Historical Provider, MD  dicyclomine (BENTYL) 10 MG capsule Take 1 before meals and at bedtime as necessary for nausea, vomiting, and diarrhea 06/18/14  Yes Peyton Najjar, MD  levothyroxine (SYNTHROID, LEVOTHROID) 200 MCG tablet Take 200 mcg by mouth daily before breakfast.   Yes Historical Provider, MD  Multiple Vitamins-Minerals (MULTIVITAMIN WITH MINERALS) tablet Take 1 tablet by mouth daily.   Yes Historical Provider, MD  ondansetron (ZOFRAN-ODT) 4 MG disintegrating tablet Take 4 mg by mouth every 8 (eight) hours as needed for nausea or vomiting.   Yes Historical Provider, MD  Probiotic Product (PROBIOTIC DAILY PO) Take 1 tablet by mouth daily.    Yes Historical Provider, MD  promethazine (PHENERGAN) 25 MG tablet Take 1 tablet (25 mg total) by mouth every 8 (eight) hours as needed for nausea or vomiting. 10/28/13  Yes Eleanore E Debbra Riding, PA-C  zolpidem (AMBIEN) 10 MG tablet Take 1 tablet (10 mg total) by mouth at bedtime  as needed for sleep. 10/21/14 11/20/14  Wanda Plump, MD   History   Social History  . Marital Status: Single    Spouse Name: N/A  . Number of Children: 0  . Years of Education: N/A   Occupational History  . 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 Smoking status: Never Smoker   . Smokeless tobacco: Never Used  . Alcohol Use: No  . Drug Use: No  . Sexual Activity:    Partners: Male    Birth Control/ Protection: Condom   Other Topics Concern  . Not on file   Social History Narrative   Lives w/ parents    Going back to school        Review of Systems  Constitutional: Negative for fever and chills.  Musculoskeletal: Positive for myalgias.  Neurological: Negative  for weakness and numbness (noticed into hands at times for years, no recent changes or worsening. ).       Objective:   Physical Exam  Constitutional: She is oriented to person, place, and time. She appears well-developed and well-nourished.  HENT:  Head: Normocephalic and atraumatic.  Eyes: Conjunctivae and EOM are normal. Pupils are equal, round, and reactive to light.  Neck: Carotid bruit is not present.  Cardiovascular: Normal rate, regular rhythm, normal heart sounds and intact distal pulses.   Pulmonary/Chest: Effort normal and breath sounds normal.  Abdominal: Soft. She exhibits no pulsatile midline mass. There is no tenderness.  Musculoskeletal:       Left shoulder: She exhibits spasm. She exhibits normal range of motion, no tenderness, no bony tenderness and normal strength.       Cervical back: She exhibits decreased range of motion (decr extension by approx 20 degrees with reproduction of upper back an top of shoulder tightness. equal rotation and lateral flexion, with slight  discomfort with L rotation. ), tenderness and spasm. She exhibits no bony tenderness and no deformity.       Arms: Equal grip strength, UE strength intact and equal.   Neurological: She is alert and oriented to person, place, and time. She has normal strength. No sensory deficit.  Reflex Scores:      Tricep reflexes are 1+ on the right side and 1+ on the left side.      Bicep reflexes are 1+ on the right side and 1+ on the left side.      Brachioradialis reflexes are 1+ on the right side and 1+ on the left side. Skin: Skin is warm and dry.  Psychiatric: She has a normal mood and affect. Her behavior is normal.  Vitals reviewed.  Filed Vitals:   11/22/14 1735  BP: 130/90  Pulse: 97  Temp: 98.2 F (36.8 C)  TempSrc: Oral  Resp: 16  SpO2: 100%       Assessment & Plan:   Gwendolyn [name] is a 29 y.o. female Neck pain on left side, Muscle spasms of neck, Pain in joint, shoulder region, left - Plan:  cyclobenzaprine (FLEXERIL) 5 MG tablet, meloxicam (MOBIC) 7.5 MG tablet  - suspected strain/overuse with lifting day prior and subsequent spasm of paraspinals in neck with radiculopathy.  strength intact, reflexes equal.  -trial of Mobic once per day if needed, heat/stretches and other info in neck care manual, and if needed - flexeril up to Q8hrs - SED of flexeril.   -if not improving into next week - consider imaging, or possible corticosteroid.    Situational stress  -  has follow up and plan with PCP. Advised to let me know if I can be of assistance.   Meds ordered this encounter  Medications  . cyclobenzaprine (FLEXERIL) 5 MG tablet    Sig: 1 pill by mouth up to every 8 hours as needed. Start with one pill by mouth each bedtime as needed due to sedation    Dispense:  15 tablet    Refill:  0  . meloxicam (MOBIC) 7.5 MG tablet    Sig: Take 1 tablet (7.5 mg total) by mouth daily. As needed.    Dispense:  30 tablet    Refill:  0   Patient Instructions  You likely have a sprained ligament or strained muscle in the left neck, which can lead to some muscle spasm as well. Try the mobic each morning (do not combine with other over the counter pain relievers), flexeril at night if needed.  Heat or ice to area as needed and the other treatments and exercises in the back care manual as tolerated. Let me know how this is doing into next wee.  If not improving into next week let me know. Sooner if worse.

## 2014-11-25 ENCOUNTER — Ambulatory Visit (INDEPENDENT_AMBULATORY_CARE_PROVIDER_SITE_OTHER): Payer: 59 | Admitting: Psychology

## 2014-11-25 DIAGNOSIS — F4323 Adjustment disorder with mixed anxiety and depressed mood: Secondary | ICD-10-CM

## 2014-11-29 ENCOUNTER — Encounter: Payer: Self-pay | Admitting: Family Medicine

## 2014-12-09 ENCOUNTER — Ambulatory Visit (INDEPENDENT_AMBULATORY_CARE_PROVIDER_SITE_OTHER): Payer: 59 | Admitting: Psychology

## 2014-12-09 DIAGNOSIS — F4323 Adjustment disorder with mixed anxiety and depressed mood: Secondary | ICD-10-CM

## 2014-12-23 ENCOUNTER — Ambulatory Visit (INDEPENDENT_AMBULATORY_CARE_PROVIDER_SITE_OTHER): Payer: 59 | Admitting: Psychology

## 2014-12-23 ENCOUNTER — Ambulatory Visit (INDEPENDENT_AMBULATORY_CARE_PROVIDER_SITE_OTHER): Payer: 59 | Admitting: Internal Medicine

## 2014-12-23 ENCOUNTER — Encounter: Payer: Self-pay | Admitting: Internal Medicine

## 2014-12-23 VITALS — BP 122/78 | HR 68 | Temp 97.6°F | Ht 68.0 in | Wt 253.2 lb

## 2014-12-23 DIAGNOSIS — G43809 Other migraine, not intractable, without status migrainosus: Secondary | ICD-10-CM | POA: Diagnosis not present

## 2014-12-23 DIAGNOSIS — F32A Depression, unspecified: Secondary | ICD-10-CM

## 2014-12-23 DIAGNOSIS — F329 Major depressive disorder, single episode, unspecified: Secondary | ICD-10-CM

## 2014-12-23 DIAGNOSIS — F4323 Adjustment disorder with mixed anxiety and depressed mood: Secondary | ICD-10-CM

## 2014-12-23 DIAGNOSIS — G4700 Insomnia, unspecified: Secondary | ICD-10-CM | POA: Diagnosis not present

## 2014-12-23 MED ORDER — LEVOTHYROXINE SODIUM 200 MCG PO TABS: 200.0000 ug | ORAL_TABLET | Freq: Every day | ORAL | Status: DC

## 2014-12-23 NOTE — Patient Instructions (Signed)
  Come back to the office in 4 to 6 months   for a routine check up     HEALTHY SLEEP Sleep hygiene: Basic rules for a good night's sleep  Sleep only as much as you need to feel rested and then get out of bed  Keep a regular sleep schedule  Avoid forcing sleep  Exercise regularly for at least 20 minutes, preferably 4 to 5 hours before bedtime  Avoid caffeinated beverages after lunch  Avoid alcohol near bedtime: no "night cap"  Avoid smoking, especially in the evening  Do not go to bed hungry  Adjust bedroom environment  Avoid prolonged use of light-emitting screens before bedtime   Deal with your worries before bedtime   \

## 2014-12-23 NOTE — Progress Notes (Signed)
Pre visit review using our clinic review tool, if applicable. No additional management support is needed unless otherwise documented below in the visit note. 

## 2014-12-23 NOTE — Progress Notes (Signed)
Subjective:    Patient ID: Gwendolyn [name], female    DOB: 1986/06/22, 29 y.o.   MRN: [id number]  DOS:  12/23/2014 Type of visit - description : f/u Interval history: Depression, status post counseling, "it helped a lot", doing better Insomnia, Ambien did not work, felt "drunk" the next day. Still having some issues Decreased concentration, still a problem on and off.   Review of Systems   Past Medical History  Diagnosis Date  . Seasonal allergies     rhinitis  . Anemia     PMH of  . Hypothyroid     hypothyroidism  . Hyperlipidemia 07/20/2010    LDL 150, HDL 37.9  . Nonspecific elevation of levels of transaminase or lactic acid dehydrogenase (LDH) 07/20/2010    PMH of; ALT 47   . Abnormal pap     9/11Hx abn.pap/colpo--bx neg, 3/14 ASCUS +HPV no colpo due to age  . Migraine headache with aura 03/13/2013    See 03/13/13 throbbing left frontal headache with radiation to the occiput. Possible prodrome of flushing. Oral and blurred vision and photosensitivity.  Family history of migraines in her mother   . Wrist fracture, right at age 89    no surgery  . Hashimoto's thyroiditis   . Celiac disease 09/26/2014  . Depression     Past Surgical History  Procedure Laterality Date  . Wisdom tooth extraction    . Knee arthroscopy  2012    L; Dr Shelle IronBeane  . Colposcopy      2011 neg    History   Social History  . Marital Status: Single    Spouse Name: N/A  . Number of Children: 0  . Years of Education: N/A   Occupational History  . 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 Smoking status: Never Smoker   . Smokeless tobacco: Never Used  . Alcohol Use: No  . Drug Use: No  . Sexual Activity:    Partners: Male    Birth Control/ Protection: Condom   Other Topics Concern  . Not on file   Social History Narrative   Lives w/ parents    Going back to school         Medication List       This list is accurate as of: 12/23/14 12:56 PM.  Always use your most recent med  list.               cyclobenzaprine 5 MG tablet  Commonly known as:  FLEXERIL  1 pill by mouth up to every 8 hours as needed. Start with one pill by mouth each bedtime as needed due to sedation     dicyclomine 10 MG capsule  Commonly known as:  BENTYL  Take 1 before meals and at bedtime as necessary for nausea, vomiting, and diarrhea     EXCEDRIN ASPIRIN FREE PO  Take by mouth.     levothyroxine 200 MCG tablet  Commonly known as:  SYNTHROID, LEVOTHROID  Take 1 tablet (200 mcg total) by mouth daily before breakfast.     meloxicam 7.5 MG tablet  Commonly known as:  MOBIC  Take 1 tablet (7.5 mg total) by mouth daily. As needed.     multivitamin with minerals tablet  Take 1 tablet by mouth daily.     ondansetron 4 MG disintegrating tablet  Commonly known as:  ZOFRAN-ODT  Take 4 mg by mouth every 8 (eight) hours as needed for nausea or  vomiting.     PROBIOTIC DAILY PO  Take 1 tablet by mouth daily.     promethazine 25 MG tablet  Commonly known as:  PHENERGAN  Take 1 tablet (25 mg total) by mouth every 8 (eight) hours as needed for nausea or vomiting.           Objective:   Physical Exam BP 122/78 mmHg  Pulse 68  Temp(Src) 97.6 F (36.4 C) (Oral)  Ht 5\' 8"  (1.727 m)  Wt 253 lb 4 oz (114.873 kg)  BMI 38.52 kg/m2  SpO2 98%  LMP 12/05/2014 General:   Well developed, well nourished . NAD.  HEENT:  Normocephalic . Face symmetric, atraumatic Neck: No thyromegaly  Neurologic:  alert & oriented X3.  Speech normal, gait appropriate for age and unassisted Psych--  Cognition and judgment appear intact.  Cooperative with normal attention span and concentration.  Behavior appropriate. No anxious or depressed appearing.       Assessment & Plan:    Hypothyroidism,  last TSH satisfactory, refill medications, no change Last visit with endocrinology Dr. Sharl MaKerr  09-2014, he noted that due to her history of celiac disease she may have problems absorbing  Synthroid  Migraine headaches No recent  unusual episodes, on prn medication  Depression Status post recent counseling, it helped significantly, no need for medication at this point. ADD?,  Focus has not improved much since the last office visit, never had a formal eval for ADD although she did have problems throughout high school and college. We discussed a formal referral, deb=clined at this point.  Tips  to manage lack of focus provided, also rec reading self help books regards the issuet   Insomnia, saw Dr Shelle Ironlance 2013 (Rx a sleep study) intolerant to [employer] Counseled, see AVS Try melatonin at home  Today , I spent more than  15  min with the patient: >50% of the time counseling regards depression, insomnia, focus mngmt

## 2014-12-31 ENCOUNTER — Ambulatory Visit (INDEPENDENT_AMBULATORY_CARE_PROVIDER_SITE_OTHER): Payer: 59

## 2014-12-31 ENCOUNTER — Ambulatory Visit (INDEPENDENT_AMBULATORY_CARE_PROVIDER_SITE_OTHER): Payer: 59 | Admitting: Family Medicine

## 2014-12-31 VITALS — BP 118/72 | HR 79 | Temp 97.9°F | Resp 18 | Ht 68.0 in | Wt 251.0 lb

## 2014-12-31 DIAGNOSIS — M6248 Contracture of muscle, other site: Secondary | ICD-10-CM | POA: Diagnosis not present

## 2014-12-31 DIAGNOSIS — M25512 Pain in left shoulder: Secondary | ICD-10-CM | POA: Diagnosis not present

## 2014-12-31 DIAGNOSIS — M542 Cervicalgia: Secondary | ICD-10-CM

## 2014-12-31 DIAGNOSIS — M7542 Impingement syndrome of left shoulder: Secondary | ICD-10-CM | POA: Diagnosis not present

## 2014-12-31 DIAGNOSIS — M62838 Other muscle spasm: Secondary | ICD-10-CM

## 2014-12-31 MED ORDER — METAXALONE 800 MG PO TABS: 400.0000 mg | ORAL_TABLET | Freq: Three times a day (TID) | ORAL | Status: DC

## 2014-12-31 NOTE — Patient Instructions (Signed)
You can try the meloxicam 1-2 per day, restart the neck exercises, heat or ice to affected areas.  You can also try some of the exercises as listed below for shoulder impingement. If the skelaxin is not expensive - try that up to 3 times per day if needed for muscle spasm. If not improving into next week, we can try prednisone.  Let me know and I can call that in for you. Return to the clinic or go to the nearest emergency room if any of your symptoms worsen or new symptoms occur.   Impingement Syndrome, Rotator Cuff, Bursitis with Rehab Impingement syndrome is a condition that involves inflammation of the tendons of the rotator cuff and the subacromial bursa, that causes pain in the shoulder. The rotator cuff consists of four tendons and muscles that control much of the shoulder and upper arm function. The subacromial bursa is a fluid filled sac that helps reduce friction between the rotator cuff and one of the bones of the shoulder (acromion). Impingement syndrome is usually an overuse injury that causes swelling of the bursa (bursitis), swelling of the tendon (tendonitis), and/or a tear of the tendon (strain). Strains are classified into three categories. Grade 1 strains cause pain, but the tendon is not lengthened. Grade 2 strains include a lengthened ligament, due to the ligament being stretched or partially ruptured. With grade 2 strains there is still function, although the function may be decreased. Grade 3 strains include a complete tear of the tendon or muscle, and function is usually impaired. SYMPTOMS   Pain around the shoulder, often at the outer portion of the upper arm.  Pain that gets worse with shoulder function, especially when reaching overhead or lifting.  Sometimes, aching when not using the arm.  Pain that wakes you up at night.  Sometimes, tenderness, swelling, warmth, or redness over the affected area.  Loss of strength.  Limited motion of the shoulder, especially reaching  behind the back (to the back pocket or to unhook bra) or across your body.  Crackling sound (crepitation) when moving the arm.  Biceps tendon pain and inflammation (in the front of the shoulder). Worse when bending the elbow or lifting. CAUSES  Impingement syndrome is often an overuse injury, in which chronic (repetitive) motions cause the tendons or bursa to become inflamed. A strain occurs when a force is paced on the tendon or muscle that is greater than it can withstand. Common mechanisms of injury include: Stress from sudden increase in duration, frequency, or intensity of training.  Direct hit (trauma) to the shoulder.  Aging, erosion of the tendon with normal use.  Bony bump on shoulder (acromial spur). RISK INCREASES WITH:  Contact sports (football, wrestling, boxing).  Throwing sports (baseball, tennis, volleyball).  Weightlifting and bodybuilding.  Heavy labor.  Previous injury to the rotator cuff, including impingement.  Poor shoulder strength and flexibility.  Failure to warm up properly before activity.  Inadequate protective equipment.  Old age.  Bony bump on shoulder (acromial spur). PREVENTION   Warm up and stretch properly before activity.  Allow for adequate recovery between workouts.  Maintain physical fitness:  Strength, flexibility, and endurance.  Cardiovascular fitness.  Learn and use proper exercise technique. PROGNOSIS  If treated properly, impingement syndrome usually goes away within 6 weeks. Sometimes surgery is required.  RELATED COMPLICATIONS   Longer healing time if not properly treated, or if not given enough time to heal.  Recurring symptoms, that result in a chronic condition.  Shoulder  stiffness, frozen shoulder, or loss of motion.  Rotator cuff tendon tear.  Recurring symptoms, especially if activity is resumed too soon, with overuse, with a direct blow, or when using poor technique. TREATMENT  Treatment first involves  the use of ice and medicine, to reduce pain and inflammation. The use of strengthening and stretching exercises may help reduce pain with activity. These exercises may be performed at home or with a therapist. If non-surgical treatment is unsuccessful after more than 6 months, surgery may be advised. After surgery and rehabilitation, activity is usually possible in 3 months.  MEDICATION  If pain medicine is needed, nonsteroidal anti-inflammatory medicines (aspirin and ibuprofen), or other minor pain relievers (acetaminophen), are often advised.  Do not take pain medicine for 7 days before surgery.  Prescription pain relievers may be given, if your caregiver thinks they are needed. Use only as directed and only as much as you need.  Corticosteroid injections may be given by your caregiver. These injections should be reserved for the most serious cases, because they may only be given a certain number of times. HEAT AND COLD  Cold treatment (icing) should be applied for 10 to 15 minutes every 2 to 3 hours for inflammation and pain, and immediately after activity that aggravates your symptoms. Use ice packs or an ice massage.  Heat treatment may be used before performing stretching and strengthening activities prescribed by your caregiver, physical therapist, or athletic trainer. Use a heat pack or a warm water soak. SEEK MEDICAL CARE IF:   Symptoms get worse or do not improve in 4 to 6 weeks, despite treatment.  New, unexplained symptoms develop. (Drugs used in treatment may produce side effects.) EXERCISES  RANGE OF MOTION (ROM) AND STRETCHING EXERCISES - Impingement Syndrome (Rotator Cuff  Tendinitis, Bursitis) These exercises may help you when beginning to rehabilitate your injury. Your symptoms may go away with or without further involvement from your physician, physical therapist or athletic trainer. While completing these exercises, remember:   Restoring tissue flexibility helps normal  motion to return to the joints. This allows healthier, less painful movement and activity.  An effective stretch should be held for at least 30 seconds.  A stretch should never be painful. You should only feel a gentle lengthening or release in the stretched tissue. STRETCH - Flexion, Standing  Stand with good posture. With an underhand grip on your right / left hand, and an overhand grip on the opposite hand, grasp a broomstick or cane so that your hands are a little more than shoulder width apart.  Keeping your right / left elbow straight and shoulder muscles relaxed, push the stick with your opposite hand, to raise your right / left arm in front of your body and then overhead. Raise your arm until you feel a stretch in your right / left shoulder, but before you have increased shoulder pain.  Try to avoid shrugging your right / left shoulder as your arm rises, by keeping your shoulder blade tucked down and toward your mid-back spine. Hold for __________ seconds.  Slowly return to the starting position. Repeat __________ times. Complete this exercise __________ times per day. STRETCH - Abduction, Supine  Lie on your back. With an underhand grip on your right / left hand and an overhand grip on the opposite hand, grasp a broomstick or cane so that your hands are a little more than shoulder width apart.  Keeping your right / left elbow straight and your shoulder muscles relaxed, push the  stick with your opposite hand, to raise your right / left arm out to the side of your body and then overhead. Raise your arm until you feel a stretch in your right / left shoulder, but before you have increased shoulder pain.  Try to avoid shrugging your right / left shoulder as your arm rises, by keeping your shoulder blade tucked down and toward your mid-back spine. Hold for __________ seconds.  Slowly return to the starting position. Repeat __________ times. Complete this exercise __________ times per  day. ROM - Flexion, Active-Assisted  Lie on your back. You may bend your knees for comfort.  Grasp a broomstick or cane so your hands are about shoulder width apart. Your right / left hand should grip the end of the stick, so that your hand is positioned "thumbs-up," as if you were about to shake hands.  Using your healthy arm to lead, raise your right / left arm overhead, until you feel a gentle stretch in your shoulder. Hold for __________ seconds.  Use the stick to assist in returning your right / left arm to its starting position. Repeat __________ times. Complete this exercise __________ times per day.  ROM - Internal Rotation, Supine   Lie on your back on a firm surface. Place your right / left elbow about 60 degrees away from your side. Elevate your elbow with a folded towel, so that the elbow and shoulder are the same height.  Using a broomstick or cane and your strong arm, pull your right / left hand toward your body until you feel a gentle stretch, but no increase in your shoulder pain. Keep your shoulder and elbow in place throughout the exercise.  Hold for __________ seconds. Slowly return to the starting position. Repeat __________ times. Complete this exercise __________ times per day. STRETCH - Internal Rotation  Place your right / left hand behind your back, palm up.  Throw a towel or belt over your opposite shoulder. Grasp the towel with your right / left hand.  While keeping an upright posture, gently pull up on the towel, until you feel a stretch in the front of your right / left shoulder.  Avoid shrugging your right / left shoulder as your arm rises, by keeping your shoulder blade tucked down and toward your mid-back spine.  Hold for __________ seconds. Release the stretch, by lowering your healthy hand. Repeat __________ times. Complete this exercise __________ times per day. ROM - Internal Rotation   Using an underhand grip, grasp a stick behind your back with  both hands.  While standing upright with good posture, slide the stick up your back until you feel a mild stretch in the front of your shoulder.  Hold for __________ seconds. Slowly return to your starting position. Repeat __________ times. Complete this exercise __________ times per day.  STRETCH - Posterior Shoulder Capsule   Stand or sit with good posture. Grasp your right / left elbow and draw it across your chest, keeping it at the same height as your shoulder.  Pull your elbow, so your upper arm comes in closer to your chest. Pull until you feel a gentle stretch in the back of your shoulder.  Hold for __________ seconds. Repeat __________ times. Complete this exercise __________ times per day. STRENGTHENING EXERCISES - Impingement Syndrome (Rotator Cuff Tendinitis, Bursitis) These exercises may help you when beginning to rehabilitate your injury. They may resolve your symptoms with or without further involvement from your physician, physical therapist or athletic trainer.  While completing these exercises, remember:  Muscles can gain both the endurance and the strength needed for everyday activities through controlled exercises.  Complete these exercises as instructed by your physician, physical therapist or athletic trainer. Increase the resistance and repetitions only as guided.  You may experience muscle soreness or fatigue, but the pain or discomfort you are trying to eliminate should never worsen during these exercises. If this pain does get worse, stop and make sure you are following the directions exactly. If the pain is still present after adjustments, discontinue the exercise until you can discuss the trouble with your clinician.  During your recovery, avoid activity or exercises which involve actions that place your injured hand or elbow above your head or behind your back or head. These positions stress the tissues which you are trying to heal. STRENGTH - Scapular Depression  and Adduction   With good posture, sit on a firm chair. Support your arms in front of you, with pillows, arm rests, or on a table top. Have your elbows in line with the sides of your body.  Gently draw your shoulder blades down and toward your mid-back spine. Gradually increase the tension, without tensing the muscles along the top of your shoulders and the back of your neck.  Hold for __________ seconds. Slowly release the tension and relax your muscles completely before starting the next repetition.  After you have practiced this exercise, remove the arm support and complete the exercise in standing as well as sitting position. Repeat __________ times. Complete this exercise __________ times per day.  STRENGTH - Shoulder Abductors, Isometric  With good posture, stand or sit about 4-6 inches from a wall, with your right / left side facing the wall.  Bend your right / left elbow. Gently press your right / left elbow into the wall. Increase the pressure gradually, until you are pressing as hard as you can, without shrugging your shoulder or increasing any shoulder discomfort.  Hold for __________ seconds.  Release the tension slowly. Relax your shoulder muscles completely before you begin the next repetition. Repeat __________ times. Complete this exercise __________ times per day.  STRENGTH - External Rotators, Isometric  Keep your right / left elbow at your side and bend it 90 degrees.  Step into a door frame so that the outside of your right / left wrist can press against the door frame without your upper arm leaving your side.  Gently press your right / left wrist into the door frame, as if you were trying to swing the back of your hand away from your stomach. Gradually increase the tension, until you are pressing as hard as you can, without shrugging your shoulder or increasing any shoulder discomfort.  Hold for __________ seconds.  Release the tension slowly. Relax your shoulder  muscles completely before you begin the next repetition. Repeat __________ times. Complete this exercise __________ times per day.  STRENGTH - Supraspinatus   Stand or sit with good posture. Grasp a __________ weight, or an exercise band or tubing, so that your hand is "thumbs-up," like you are shaking hands.  Slowly lift your right / left arm in a "V" away from your thigh, diagonally into the space between your side and straight ahead. Lift your hand to shoulder height or as far as you can, without increasing any shoulder pain. At first, many people do not lift their hands above shoulder height.  Avoid shrugging your right / left shoulder as your arm rises, by keeping  your shoulder blade tucked down and toward your mid-back spine.  Hold for __________ seconds. Control the descent of your hand, as you slowly return to your starting position. Repeat __________ times. Complete this exercise __________ times per day.  STRENGTH - External Rotators  Secure a rubber exercise band or tubing to a fixed object (table, pole) so that it is at the same height as your right / left elbow when you are standing or sitting on a firm surface.  Stand or sit so that the secured exercise band is at your uninjured side.  Bend your right / left elbow 90 degrees. Place a folded towel or small pillow under your right / left arm, so that your elbow is a few inches away from your side.  Keeping the tension on the exercise band, pull it away from your body, as if pivoting on your elbow. Be sure to keep your body steady, so that the movement is coming only from your rotating shoulder.  Hold for __________ seconds. Release the tension in a controlled manner, as you return to the starting position. Repeat __________ times. Complete this exercise __________ times per day.  STRENGTH - Internal Rotators   Secure a rubber exercise band or tubing to a fixed object (table, pole) so that it is at the same height as your right /  left elbow when you are standing or sitting on a firm surface.  Stand or sit so that the secured exercise band is at your right / left side.  Bend your elbow 90 degrees. Place a folded towel or small pillow under your right / left arm so that your elbow is a few inches away from your side.  Keeping the tension on the exercise band, pull it across your body, toward your stomach. Be sure to keep your body steady, so that the movement is coming only from your rotating shoulder.  Hold for __________ seconds. Release the tension in a controlled manner, as you return to the starting position. Repeat __________ times. Complete this exercise __________ times per day.  STRENGTH - Scapular Protractors, Standing   Stand arms length away from a wall. Place your hands on the wall, keeping your elbows straight.  Begin by dropping your shoulder blades down and toward your mid-back spine.  To strengthen your protractors, keep your shoulder blades down, but slide them forward on your rib cage. It will feel as if you are lifting the back of your rib cage away from the wall. This is a subtle motion and can be challenging to complete. Ask your caregiver for further instruction, if you are not sure you are doing the exercise correctly.  Hold for __________ seconds. Slowly return to the starting position, resting the muscles completely before starting the next repetition. Repeat __________ times. Complete this exercise __________ times per day. STRENGTH - Scapular Protractors, Supine  Lie on your back on a firm surface. Extend your right / left arm straight into the air while holding a __________ weight in your hand.  Keeping your head and back in place, lift your shoulder off the floor.  Hold for __________ seconds. Slowly return to the starting position, and allow your muscles to relax completely before starting the next repetition. Repeat __________ times. Complete this exercise __________ times per  day. STRENGTH - Scapular Protractors, Quadruped  Get onto your hands and knees, with your shoulders directly over your hands (or as close as you can be, comfortably).  Keeping your elbows locked, lift the  back of your rib cage up into your shoulder blades, so your mid-back rounds out. Keep your neck muscles relaxed.  Hold this position for __________ seconds. Slowly return to the starting position and allow your muscles to relax completely before starting the next repetition. Repeat __________ times. Complete this exercise __________ times per day.  STRENGTH - Scapular Retractors  Secure a rubber exercise band or tubing to a fixed object (table, pole), so that it is at the height of your shoulders when you are either standing, or sitting on a firm armless chair.  With a palm down grip, grasp an end of the band in each hand. Straighten your elbows and lift your hands straight in front of you, at shoulder height. Step back, away from the secured end of the band, until it becomes tense.  Squeezing your shoulder blades together, draw your elbows back toward your sides, as you bend them. Keep your upper arms lifted away from your body throughout the exercise.  Hold for __________ seconds. Slowly ease the tension on the band, as you reverse the directions and return to the starting position. Repeat __________ times. Complete this exercise __________ times per day. STRENGTH - Shoulder Extensors   Secure a rubber exercise band or tubing to a fixed object (table, pole) so that it is at the height of your shoulders when you are either standing, or sitting on a firm armless chair.  With a thumbs-up grip, grasp an end of the band in each hand. Straighten your elbows and lift your hands straight in front of you, at shoulder height. Step back, away from the secured end of the band, until it becomes tense.  Squeezing your shoulder blades together, pull your hands down to the sides of your thighs. Do not  allow your hands to go behind you.  Hold for __________ seconds. Slowly ease the tension on the band, as you reverse the directions and return to the starting position. Repeat __________ times. Complete this exercise __________ times per day.  STRENGTH - Scapular Retractors and External Rotators   Secure a rubber exercise band or tubing to a fixed object (table, pole) so that it is at the height as your shoulders, when you are either standing, or sitting on a firm armless chair.  With a palm down grip, grasp an end of the band in each hand. Bend your elbows 90 degrees and lift your elbows to shoulder height, at your sides. Step back, away from the secured end of the band, until it becomes tense.  Squeezing your shoulder blades together, rotate your shoulders so that your upper arms and elbows remain stationary, but your fists travel upward to head height.  Hold for __________ seconds. Slowly ease the tension on the band, as you reverse the directions and return to the starting position. Repeat __________ times. Complete this exercise __________ times per day.  STRENGTH - Scapular Retractors and External Rotators, Rowing   Secure a rubber exercise band or tubing to a fixed object (table, pole) so that it is at the height of your shoulders, when you are either standing, or sitting on a firm armless chair.  With a palm down grip, grasp an end of the band in each hand. Straighten your elbows and lift your hands straight in front of you, at shoulder height. Step back, away from the secured end of the band, until it becomes tense.  Step 1: Squeeze your shoulder blades together. Bending your elbows, draw your hands to your chest,  as if you are rowing a boat. At the end of this motion, your hands and elbow should be at shoulder height and your elbows should be out to your sides.  Step 2: Rotate your shoulders, to raise your hands above your head. Your forearms should be vertical and your upper arms  should be horizontal.  Hold for __________ seconds. Slowly ease the tension on the band, as you reverse the directions and return to the starting position. Repeat __________ times. Complete this exercise __________ times per day.  STRENGTH - Scapular Depressors  Find a sturdy chair without wheels, such as a dining room chair.  Keeping your feet on the floor, and your hands on the chair arms, lift your bottom up from the seat, and lock your elbows.  Keeping your elbows straight, allow gravity to pull your body weight down. Your shoulders will rise toward your ears.  Raise your body against gravity by drawing your shoulder blades down your back, shortening the distance between your shoulders and ears. Although your feet should always maintain contact with the floor, your feet should progressively support less body weight, as you get stronger.  Hold for __________ seconds. In a controlled and slow manner, lower your body weight to begin the next repetition. Repeat __________ times. Complete this exercise __________ times per day.  Document Released: 07/26/2005 Document Revised: 10/18/2011 Document Reviewed: 11/07/2008 [facility] Patient Information 2015 [place], [place]. This information is not intended to replace advice given to you by your health care provider. Make sure you discuss any questions you have with your health care provider.

## 2014-12-31 NOTE — Progress Notes (Addendum)
Subjective:    Patient ID: Gwendolyn [name], female    DOB: 03-01-1986, 29 y.o.   MRN: [id number] This chart was scribed for Gwendolyn Staggers, MD by [name], Medical Scribe. This patient was seen in Room 13 and the patient's care was started a 4:30 PM.  Chief Complaint  Patient presents with  . Shoulder Pain    left shoulder x2-3 days    HPI HPI Comments: [name] is a 29 y.o. female who presents to [facility] complaining of shoulder pain for the last 5 weeks. Pt states her sx resolved within two weeks of onset. Pt states her sx recurred two weeks ago with intermittent pain for a one week and constant pain for the last week with pain radiating down her left arm. Pt states she woke up this morning with some numbness in the fingers of her left hand. Pt states she has some limitation in strength in left arm due to pain. Pt states she stopped taking mobic two weeks ago, but restarted it three days ago. Pt states she stopped taking flexeril three days after her first visit due to a hangover feeling. Pt denies rash. Pt stated she did the prescribed exercises for two weeks.   I saw her on April 15th for left sided shoulder pain, secondary to neck pain, secondary to muscle spasm. Sx initially noted after doing some moving one week prior to visit, with persistent pain for six days. Treated with mobic 7.5 Qd and flexeril 5.0mg  Qhs. Activity modification and nsck care manual for exercise guidelines. Per e-mail, one week later sx were improving but still som soreness, she is here for follw up today.   Patient Active Problem List   Diagnosis Date Noted  . Depression 10/21/2014  . Celiac disease 09/26/2014  . Annual physical exam 12/21/2013  . Palpitations 12/21/2013  . Migraine headache with aura 03/13/2013  . Unspecified vitamin D deficiency 12/19/2012  . Obesity, unspecified 11/02/2012  . HYPERLIPIDEMIA 07/20/2010  . Hypothyroidism 06/18/2009  . ANEMIA-IRON DEFICIENCY 05/16/2009  . Seasonal allergies  05/16/2009  . SLEEP DISORDER 05/16/2009   Past Medical History  Diagnosis Date  . Seasonal allergies     rhinitis  . Anemia     PMH of  . Hypothyroid     hypothyroidism  . Hyperlipidemia 07/20/2010    LDL 150, HDL 37.9  . Nonspecific elevation of levels of transaminase or lactic acid dehydrogenase (LDH) 07/20/2010    PMH of; ALT 47   . Abnormal pap     9/11Hx abn.pap/colpo--bx neg, 3/14 ASCUS +HPV no colpo due to age  . Migraine headache with aura 03/13/2013    See 03/13/13 throbbing left frontal headache with radiation to the occiput. Possible prodrome of flushing. Oral and blurred vision and photosensitivity.  Family history of migraines in her mother   . Wrist fracture, right at age 63    no surgery  . Hashimoto's thyroiditis   . Celiac disease 09/26/2014  . Depression    Past Surgical History  Procedure Laterality Date  . Wisdom tooth extraction    . Knee arthroscopy  2012    L; Dr Shelle Iron  . Colposcopy      2011 neg   Allergies  Allergen Reactions  . Gluten Meal    Prior to Admission medications   Medication Sig Start Date End Date Taking? Authorizing Provider  Acetaminophen-Caffeine (EXCEDRIN ASPIRIN FREE PO) Take by mouth.   Yes Historical Provider, MD  dicyclomine (BENTYL) 10 MG capsule  Take 1 before meals and at bedtime as necessary for nausea, vomiting, and diarrhea 06/18/14  Yes Peyton Najjar, MD  levothyroxine (SYNTHROID, LEVOTHROID) 200 MCG tablet Take 1 tablet (200 mcg total) by mouth daily before breakfast. 12/23/14  Yes Wanda Plump, MD  Multiple Vitamins-Minerals (MULTIVITAMIN WITH MINERALS) tablet Take 1 tablet by mouth daily.   Yes Historical Provider, MD  ondansetron (ZOFRAN-ODT) 4 MG disintegrating tablet Take 4 mg by mouth every 8 (eight) hours as needed for nausea or vomiting.   Yes Historical Provider, MD  Probiotic Product (PROBIOTIC DAILY PO) Take 1 tablet by mouth daily.    Yes Historical Provider, MD  promethazine (PHENERGAN) 25 MG tablet Take 1 tablet  (25 mg total) by mouth every 8 (eight) hours as needed for nausea or vomiting. 10/28/13  Yes Eleanore E Egan, PA-C  cyclobenzaprine (FLEXERIL) 5 MG tablet 1 pill by mouth up to every 8 hours as needed. Start with one pill by mouth each bedtime as needed due to sedation Patient not taking: Reported on 12/23/2014 11/22/14   Shade Flood, MD  meloxicam (MOBIC) 7.5 MG tablet Take 1 tablet (7.5 mg total) by mouth daily. As needed. Patient not taking: Reported on 12/23/2014 11/22/14   Shade Flood, MD   History   Social History  . Marital Status: Single    Spouse Name: N/A  . Number of Children: 0  . Years of Education: N/A   Occupational History  . 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 Smoking status: Never Smoker   . Smokeless tobacco: Never Used  . Alcohol Use: No  . Drug Use: No  . Sexual Activity:    Partners: Male    Birth Control/ Protection: Condom   Other Topics Concern  . Not on file   Social History Narrative   Lives w/ parents    Going back to school     Review of Systems  Constitutional: Negative for fever and chills.  Musculoskeletal: Positive for neck pain.       Shoulder pain  Neurological: Positive for weakness (Mild, secondary to pain) and numbness (in left fingers).       Objective:   Physical Exam  Constitutional: She is oriented to person, place, and time. She appears well-developed and well-nourished. No distress.  HENT:  Head: Normocephalic and atraumatic.  Eyes: Pupils are equal, round, and reactive to light.  Neck: Neck supple.  Cardiovascular: Normal rate.   Pulmonary/Chest: Effort normal. No respiratory distress.  Musculoskeletal: Normal range of motion.  Cervical exam reproduces pulling sensation. Mild discomfort with lateral flexion in neck. Minimal decreased rotation, 10 degrees on left, 5 degrees on right. Tender along left trapezius, dorsum of shoulder and lateral upper arm. No rash. Left shoulder exam: clavical non tender.  [place] and AC joints were non tender. ROM is intact with the exception of internal rotation on left, approximately 2 vertebrae less than right side. Full rotator cuff strength, but pain with rotation and empty can testing. Negative Obrian's (pain with thumb down and thumb up). Positive NEER, positive Hawkins.   Neurological: She is alert and oriented to person, place, and time. Coordination normal.  Skin: Skin is warm and dry. She is not diaphoretic.  Psychiatric: She has a normal mood and affect. Her behavior is normal.  Nursing note and vitals reviewed.    Filed Vitals:   12/31/14 1607  BP: 118/72  Pulse: 79  Temp: 97.9 F (36.6 C)  TempSrc: Oral  Resp: 18  Height: 5\' 8"  (1.727 m)  Weight: 251 lb (113.853 kg)  SpO2: 99%   [facility] reading (PRIMARY) by  Dr. Neva Seat: L shoulder - no fx or acute findings.   Cspine - No fx or acute findings.      Assessment & Plan:   ARYAHI DENZLER is a 29 y.o. female Neck pain on left side - Plan: DG Cervical Spine 2 or 3 views  Muscle spasms of neck - Plan: metaxalone (SKELAXIN) 800 MG tablet  Left shoulder pain - Plan: DG Shoulder Left  Impingement syndrome of left shoulder  Suspect combination of impingement/RTC tendinosis and intermittent radicular sx's from neck. Had improved prior with HEP and meloxicam.  -Restart HEP for neck and impingement.  meloxicam 1-2 QD prn, and trial of skelaxin as may be less sedating than flexeril.   -if not improving in next week - consider prednisone course. (SED of prednisone - has taken prior).     Meds ordered this encounter  Medications  . metaxalone (SKELAXIN) 800 MG tablet    Sig: Take 0.5-1 tablets (400-800 mg total) by mouth 3 (three) times daily.    Dispense:  20 tablet    Refill:  0   Patient Instructions  You can try the meloxicam 1-2 per day, restart the neck exercises, heat or ice to affected areas.  You can also try some of the exercises as listed below for shoulder impingement. If the skelaxin is  not expensive - try that up to 3 times per day if needed for muscle spasm. If not improving into next week, we can try prednisone.  Let me know and I can call that in for you. Return to the clinic or go to the nearest emergency room if any of your symptoms worsen or new symptoms occur.   Impingement Syndrome, Rotator Cuff, Bursitis with Rehab Impingement syndrome is a condition that involves inflammation of the tendons of the rotator cuff and the subacromial bursa, that causes pain in the shoulder. The rotator cuff consists of four tendons and muscles that control much of the shoulder and upper arm function. The subacromial bursa is a fluid filled sac that helps reduce friction between the rotator cuff and one of the bones of the shoulder (acromion). Impingement syndrome is usually an overuse injury that causes swelling of the bursa (bursitis), swelling of the tendon (tendonitis), and/or a tear of the tendon (strain). Strains are classified into three categories. Grade 1 strains cause pain, but the tendon is not lengthened. Grade 2 strains include a lengthened ligament, due to the ligament being stretched or partially ruptured. With grade 2 strains there is still function, although the function may be decreased. Grade 3 strains include a complete tear of the tendon or muscle, and function is usually impaired. SYMPTOMS   Pain around the shoulder, often at the outer portion of the upper arm.  Pain that gets worse with shoulder function, especially when reaching overhead or lifting.  Sometimes, aching when not using the arm.  Pain that wakes you up at night.  Sometimes, tenderness, swelling, warmth, or redness over the affected area.  Loss of strength.  Limited motion of the shoulder, especially reaching behind the back (to the back pocket or to unhook bra) or across your body.  Crackling sound (crepitation) when moving the arm.  Biceps tendon pain and inflammation (in the front of the  shoulder). Worse when bending the elbow or lifting. CAUSES  Impingement syndrome is often  an overuse injury, in which chronic (repetitive) motions cause the tendons or bursa to become inflamed. A strain occurs when a force is paced on the tendon or muscle that is greater than it can withstand. Common mechanisms of injury include: Stress from sudden increase in duration, frequency, or intensity of training.  Direct hit (trauma) to the shoulder.  Aging, erosion of the tendon with normal use.  Bony bump on shoulder (acromial spur). RISK INCREASES WITH:  Contact sports (football, wrestling, boxing).  Throwing sports (baseball, tennis, volleyball).  Weightlifting and bodybuilding.  Heavy labor.  Previous injury to the rotator cuff, including impingement.  Poor shoulder strength and flexibility.  Failure to warm up properly before activity.  Inadequate protective equipment.  Old age.  Bony bump on shoulder (acromial spur). PREVENTION   Warm up and stretch properly before activity.  Allow for adequate recovery between workouts.  Maintain physical fitness:  Strength, flexibility, and endurance.  Cardiovascular fitness.  Learn and use proper exercise technique. PROGNOSIS  If treated properly, impingement syndrome usually goes away within 6 weeks. Sometimes surgery is required.  RELATED COMPLICATIONS   Longer healing time if not properly treated, or if not given enough time to heal.  Recurring symptoms, that result in a chronic condition.  Shoulder stiffness, frozen shoulder, or loss of motion.  Rotator cuff tendon tear.  Recurring symptoms, especially if activity is resumed too soon, with overuse, with a direct blow, or when using poor technique. TREATMENT  Treatment first involves the use of ice and medicine, to reduce pain and inflammation. The use of strengthening and stretching exercises may help reduce pain with activity. These exercises may be performed at home  or with a therapist. If non-surgical treatment is unsuccessful after more than 6 months, surgery may be advised. After surgery and rehabilitation, activity is usually possible in 3 months.  MEDICATION  If pain medicine is needed, nonsteroidal anti-inflammatory medicines (aspirin and ibuprofen), or other minor pain relievers (acetaminophen), are often advised.  Do not take pain medicine for 7 days before surgery.  Prescription pain relievers may be given, if your caregiver thinks they are needed. Use only as directed and only as much as you need.  Corticosteroid injections may be given by your caregiver. These injections should be reserved for the most serious cases, because they may only be given a certain number of times. HEAT AND COLD  Cold treatment (icing) should be applied for 10 to 15 minutes every 2 to 3 hours for inflammation and pain, and immediately after activity that aggravates your symptoms. Use ice packs or an ice massage.  Heat treatment may be used before performing stretching and strengthening activities prescribed by your caregiver, physical therapist, or athletic trainer. Use a heat pack or a warm water soak. SEEK MEDICAL CARE IF:   Symptoms get worse or do not improve in 4 to 6 weeks, despite treatment.  New, unexplained symptoms develop. (Drugs used in treatment may produce side effects.) EXERCISES  RANGE OF MOTION (ROM) AND STRETCHING EXERCISES - Impingement Syndrome (Rotator Cuff  Tendinitis, Bursitis) These exercises may help you when beginning to rehabilitate your injury. Your symptoms may go away with or without further involvement from your physician, physical therapist or athletic trainer. While completing these exercises, remember:   Restoring tissue flexibility helps normal motion to return to the joints. This allows healthier, less painful movement and activity.  An effective stretch should be held for at least 30 seconds.  A stretch should never be  painful.  You should only feel a gentle lengthening or release in the stretched tissue. STRETCH - Flexion, Standing  Stand with good posture. With an underhand grip on your right / left hand, and an overhand grip on the opposite hand, grasp a broomstick or cane so that your hands are a little more than shoulder width apart.  Keeping your right / left elbow straight and shoulder muscles relaxed, push the stick with your opposite hand, to raise your right / left arm in front of your body and then overhead. Raise your arm until you feel a stretch in your right / left shoulder, but before you have increased shoulder pain.  Try to avoid shrugging your right / left shoulder as your arm rises, by keeping your shoulder blade tucked down and toward your mid-back spine. Hold for __________ seconds.  Slowly return to the starting position. Repeat __________ times. Complete this exercise __________ times per day. STRETCH - Abduction, Supine  Lie on your back. With an underhand grip on your right / left hand and an overhand grip on the opposite hand, grasp a broomstick or cane so that your hands are a little more than shoulder width apart.  Keeping your right / left elbow straight and your shoulder muscles relaxed, push the stick with your opposite hand, to raise your right / left arm out to the side of your body and then overhead. Raise your arm until you feel a stretch in your right / left shoulder, but before you have increased shoulder pain.  Try to avoid shrugging your right / left shoulder as your arm rises, by keeping your shoulder blade tucked down and toward your mid-back spine. Hold for __________ seconds.  Slowly return to the starting position. Repeat __________ times. Complete this exercise __________ times per day. ROM - Flexion, Active-Assisted  Lie on your back. You may bend your knees for comfort.  Grasp a broomstick or cane so your hands are about shoulder width apart. Your right / left  hand should grip the end of the stick, so that your hand is positioned "thumbs-up," as if you were about to shake hands.  Using your healthy arm to lead, raise your right / left arm overhead, until you feel a gentle stretch in your shoulder. Hold for __________ seconds.  Use the stick to assist in returning your right / left arm to its starting position. Repeat __________ times. Complete this exercise __________ times per day.  ROM - Internal Rotation, Supine   Lie on your back on a firm surface. Place your right / left elbow about 60 degrees away from your side. Elevate your elbow with a folded towel, so that the elbow and shoulder are the same height.  Using a broomstick or cane and your strong arm, pull your right / left hand toward your body until you feel a gentle stretch, but no increase in your shoulder pain. Keep your shoulder and elbow in place throughout the exercise.  Hold for __________ seconds. Slowly return to the starting position. Repeat __________ times. Complete this exercise __________ times per day. STRETCH - Internal Rotation  Place your right / left hand behind your back, palm up.  Throw a towel or belt over your opposite shoulder. Grasp the towel with your right / left hand.  While keeping an upright posture, gently pull up on the towel, until you feel a stretch in the front of your right / left shoulder.  Avoid shrugging your right / left shoulder as your  arm rises, by keeping your shoulder blade tucked down and toward your mid-back spine.  Hold for __________ seconds. Release the stretch, by lowering your healthy hand. Repeat __________ times. Complete this exercise __________ times per day. ROM - Internal Rotation   Using an underhand grip, grasp a stick behind your back with both hands.  While standing upright with good posture, slide the stick up your back until you feel a mild stretch in the front of your shoulder.  Hold for __________ seconds. Slowly  return to your starting position. Repeat __________ times. Complete this exercise __________ times per day.  STRETCH - Posterior Shoulder Capsule   Stand or sit with good posture. Grasp your right / left elbow and draw it across your chest, keeping it at the same height as your shoulder.  Pull your elbow, so your upper arm comes in closer to your chest. Pull until you feel a gentle stretch in the back of your shoulder.  Hold for __________ seconds. Repeat __________ times. Complete this exercise __________ times per day. STRENGTHENING EXERCISES - Impingement Syndrome (Rotator Cuff Tendinitis, Bursitis) These exercises may help you when beginning to rehabilitate your injury. They may resolve your symptoms with or without further involvement from your physician, physical therapist or athletic trainer. While completing these exercises, remember:  Muscles can gain both the endurance and the strength needed for everyday activities through controlled exercises.  Complete these exercises as instructed by your physician, physical therapist or athletic trainer. Increase the resistance and repetitions only as guided.  You may experience muscle soreness or fatigue, but the pain or discomfort you are trying to eliminate should never worsen during these exercises. If this pain does get worse, stop and make sure you are following the directions exactly. If the pain is still present after adjustments, discontinue the exercise until you can discuss the trouble with your clinician.  During your recovery, avoid activity or exercises which involve actions that place your injured hand or elbow above your head or behind your back or head. These positions stress the tissues which you are trying to heal. STRENGTH - Scapular Depression and Adduction   With good posture, sit on a firm chair. Support your arms in front of you, with pillows, arm rests, or on a table top. Have your elbows in line with the sides of your  body.  Gently draw your shoulder blades down and toward your mid-back spine. Gradually increase the tension, without tensing the muscles along the top of your shoulders and the back of your neck.  Hold for __________ seconds. Slowly release the tension and relax your muscles completely before starting the next repetition.  After you have practiced this exercise, remove the arm support and complete the exercise in standing as well as sitting position. Repeat __________ times. Complete this exercise __________ times per day.  STRENGTH - Shoulder Abductors, Isometric  With good posture, stand or sit about 4-6 inches from a wall, with your right / left side facing the wall.  Bend your right / left elbow. Gently press your right / left elbow into the wall. Increase the pressure gradually, until you are pressing as hard as you can, without shrugging your shoulder or increasing any shoulder discomfort.  Hold for __________ seconds.  Release the tension slowly. Relax your shoulder muscles completely before you begin the next repetition. Repeat __________ times. Complete this exercise __________ times per day.  STRENGTH - External Rotators, Isometric  Keep your right / left elbow at your  side and bend it 90 degrees.  Step into a door frame so that the outside of your right / left wrist can press against the door frame without your upper arm leaving your side.  Gently press your right / left wrist into the door frame, as if you were trying to swing the back of your hand away from your stomach. Gradually increase the tension, until you are pressing as hard as you can, without shrugging your shoulder or increasing any shoulder discomfort.  Hold for __________ seconds.  Release the tension slowly. Relax your shoulder muscles completely before you begin the next repetition. Repeat __________ times. Complete this exercise __________ times per day.  STRENGTH - Supraspinatus   Stand or sit with good  posture. Grasp a __________ weight, or an exercise band or tubing, so that your hand is "thumbs-up," like you are shaking hands.  Slowly lift your right / left arm in a "V" away from your thigh, diagonally into the space between your side and straight ahead. Lift your hand to shoulder height or as far as you can, without increasing any shoulder pain. At first, many people do not lift their hands above shoulder height.  Avoid shrugging your right / left shoulder as your arm rises, by keeping your shoulder blade tucked down and toward your mid-back spine.  Hold for __________ seconds. Control the descent of your hand, as you slowly return to your starting position. Repeat __________ times. Complete this exercise __________ times per day.  STRENGTH - External Rotators  Secure a rubber exercise band or tubing to a fixed object (table, pole) so that it is at the same height as your right / left elbow when you are standing or sitting on a firm surface.  Stand or sit so that the secured exercise band is at your uninjured side.  Bend your right / left elbow 90 degrees. Place a folded towel or small pillow under your right / left arm, so that your elbow is a few inches away from your side.  Keeping the tension on the exercise band, pull it away from your body, as if pivoting on your elbow. Be sure to keep your body steady, so that the movement is coming only from your rotating shoulder.  Hold for __________ seconds. Release the tension in a controlled manner, as you return to the starting position. Repeat __________ times. Complete this exercise __________ times per day.  STRENGTH - Internal Rotators   Secure a rubber exercise band or tubing to a fixed object (table, pole) so that it is at the same height as your right / left elbow when you are standing or sitting on a firm surface.  Stand or sit so that the secured exercise band is at your right / left side.  Bend your elbow 90 degrees. Place a  folded towel or small pillow under your right / left arm so that your elbow is a few inches away from your side.  Keeping the tension on the exercise band, pull it across your body, toward your stomach. Be sure to keep your body steady, so that the movement is coming only from your rotating shoulder.  Hold for __________ seconds. Release the tension in a controlled manner, as you return to the starting position. Repeat __________ times. Complete this exercise __________ times per day.  STRENGTH - Scapular Protractors, Standing   Stand arms length away from a wall. Place your hands on the wall, keeping your elbows straight.  Begin by  dropping your shoulder blades down and toward your mid-back spine.  To strengthen your protractors, keep your shoulder blades down, but slide them forward on your rib cage. It will feel as if you are lifting the back of your rib cage away from the wall. This is a subtle motion and can be challenging to complete. Ask your caregiver for further instruction, if you are not sure you are doing the exercise correctly.  Hold for __________ seconds. Slowly return to the starting position, resting the muscles completely before starting the next repetition. Repeat __________ times. Complete this exercise __________ times per day. STRENGTH - Scapular Protractors, Supine  Lie on your back on a firm surface. Extend your right / left arm straight into the air while holding a __________ weight in your hand.  Keeping your head and back in place, lift your shoulder off the floor.  Hold for __________ seconds. Slowly return to the starting position, and allow your muscles to relax completely before starting the next repetition. Repeat __________ times. Complete this exercise __________ times per day. STRENGTH - Scapular Protractors, Quadruped  Get onto your hands and knees, with your shoulders directly over your hands (or as close as you can be, comfortably).  Keeping your  elbows locked, lift the back of your rib cage up into your shoulder blades, so your mid-back rounds out. Keep your neck muscles relaxed.  Hold this position for __________ seconds. Slowly return to the starting position and allow your muscles to relax completely before starting the next repetition. Repeat __________ times. Complete this exercise __________ times per day.  STRENGTH - Scapular Retractors  Secure a rubber exercise band or tubing to a fixed object (table, pole), so that it is at the height of your shoulders when you are either standing, or sitting on a firm armless chair.  With a palm down grip, grasp an end of the band in each hand. Straighten your elbows and lift your hands straight in front of you, at shoulder height. Step back, away from the secured end of the band, until it becomes tense.  Squeezing your shoulder blades together, draw your elbows back toward your sides, as you bend them. Keep your upper arms lifted away from your body throughout the exercise.  Hold for __________ seconds. Slowly ease the tension on the band, as you reverse the directions and return to the starting position. Repeat __________ times. Complete this exercise __________ times per day. STRENGTH - Shoulder Extensors   Secure a rubber exercise band or tubing to a fixed object (table, pole) so that it is at the height of your shoulders when you are either standing, or sitting on a firm armless chair.  With a thumbs-up grip, grasp an end of the band in each hand. Straighten your elbows and lift your hands straight in front of you, at shoulder height. Step back, away from the secured end of the band, until it becomes tense.  Squeezing your shoulder blades together, pull your hands down to the sides of your thighs. Do not allow your hands to go behind you.  Hold for __________ seconds. Slowly ease the tension on the band, as you reverse the directions and return to the starting position. Repeat  __________ times. Complete this exercise __________ times per day.  STRENGTH - Scapular Retractors and External Rotators   Secure a rubber exercise band or tubing to a fixed object (table, pole) so that it is at the height as your shoulders, when you are either  standing, or sitting on a firm armless chair.  With a palm down grip, grasp an end of the band in each hand. Bend your elbows 90 degrees and lift your elbows to shoulder height, at your sides. Step back, away from the secured end of the band, until it becomes tense.  Squeezing your shoulder blades together, rotate your shoulders so that your upper arms and elbows remain stationary, but your fists travel upward to head height.  Hold for __________ seconds. Slowly ease the tension on the band, as you reverse the directions and return to the starting position. Repeat __________ times. Complete this exercise __________ times per day.  STRENGTH - Scapular Retractors and External Rotators, Rowing   Secure a rubber exercise band or tubing to a fixed object (table, pole) so that it is at the height of your shoulders, when you are either standing, or sitting on a firm armless chair.  With a palm down grip, grasp an end of the band in each hand. Straighten your elbows and lift your hands straight in front of you, at shoulder height. Step back, away from the secured end of the band, until it becomes tense.  Step 1: Squeeze your shoulder blades together. Bending your elbows, draw your hands to your chest, as if you are rowing a boat. At the end of this motion, your hands and elbow should be at shoulder height and your elbows should be out to your sides.  Step 2: Rotate your shoulders, to raise your hands above your head. Your forearms should be vertical and your upper arms should be horizontal.  Hold for __________ seconds. Slowly ease the tension on the band, as you reverse the directions and return to the starting position. Repeat __________  times. Complete this exercise __________ times per day.  STRENGTH - Scapular Depressors  Find a sturdy chair without wheels, such as a dining room chair.  Keeping your feet on the floor, and your hands on the chair arms, lift your bottom up from the seat, and lock your elbows.  Keeping your elbows straight, allow gravity to pull your body weight down. Your shoulders will rise toward your ears.  Raise your body against gravity by drawing your shoulder blades down your back, shortening the distance between your shoulders and ears. Although your feet should always maintain contact with the floor, your feet should progressively support less body weight, as you get stronger.  Hold for __________ seconds. In a controlled and slow manner, lower your body weight to begin the next repetition. Repeat __________ times. Complete this exercise __________ times per day.  Document Released: 07/26/2005 Document Revised: 10/18/2011 Document Reviewed: 11/07/2008 [facility] Patient Information 2015 [place], [place]. This information is not intended to replace advice given to you by your health care provider. Make sure you discuss any questions you have with your health care provider.      I personally performed the services described in this documentation, which was scribed in my presence. The recorded information has been reviewed and considered, and addended by me as needed.

## 2015-01-17 ENCOUNTER — Telehealth: Payer: Self-pay | Admitting: Internal Medicine

## 2015-01-17 NOTE — Telephone Encounter (Signed)
Pre Visit letter sent  °

## 2015-01-30 ENCOUNTER — Encounter: Payer: Self-pay | Admitting: Family Medicine

## 2015-02-07 ENCOUNTER — Encounter: Payer: 59 | Admitting: Internal Medicine

## 2015-02-20 ENCOUNTER — Telehealth: Payer: Self-pay | Admitting: *Deleted

## 2015-02-20 NOTE — Telephone Encounter (Signed)
Unable to reach patient at time of Pre-Visit Call.  Left message for patient to return call when available.    

## 2015-02-21 ENCOUNTER — Ambulatory Visit (INDEPENDENT_AMBULATORY_CARE_PROVIDER_SITE_OTHER): Payer: 59 | Admitting: Internal Medicine

## 2015-02-21 ENCOUNTER — Encounter: Payer: Self-pay | Admitting: Internal Medicine

## 2015-02-21 VITALS — BP 118/78 | HR 63 | Temp 97.8°F | Ht 68.0 in | Wt 249.1 lb

## 2015-02-21 DIAGNOSIS — K900 Celiac disease: Secondary | ICD-10-CM

## 2015-02-21 DIAGNOSIS — G43109 Migraine with aura, not intractable, without status migrainosus: Secondary | ICD-10-CM

## 2015-02-21 DIAGNOSIS — Z0000 Encounter for general adult medical examination without abnormal findings: Secondary | ICD-10-CM | POA: Diagnosis not present

## 2015-02-21 DIAGNOSIS — R1013 Epigastric pain: Secondary | ICD-10-CM

## 2015-02-21 NOTE — Progress Notes (Signed)
Pre visit review using our clinic review tool, if applicable. No additional management support is needed unless otherwise documented below in the visit note. 

## 2015-02-21 NOTE — Assessment & Plan Note (Addendum)
Today she reports that continue with dyspepsia despite trying to eat gluten-free,  Plan: Check a ultrasound, also asked patient to see GI again

## 2015-02-21 NOTE — Assessment & Plan Note (Signed)
Td 09 Labs from the last year reviewed, all okay. Diet and exercise discussed Female care per gyn last visit with Dalia Headingeborah Lenner few months ago

## 2015-02-21 NOTE — Progress Notes (Signed)
Subjective:    Patient ID: Gwendolyn [name], female    DOB: Jan 06, 1986, 29 y.o.   MRN: [id number]  DOS:  02/21/2015 Type of visit - description : Complete physical exam Interval history: Several concerns, see review of systems   Review of Systems Constitutional: No fever. No chills. No unusual sweats  HEENT: No dental problems, no ear discharge, no facial swelling, no voice changes. No eye discharge, no eye  redness , no  intolerance to light   Respiratory: No wheezing , no  difficulty breathing. No cough , no mucus production  Cardiovascular: No CP, no leg swelling , no  Palpitations  GI:  Complaining of stomach upset despite trying to eat a gluten-free diet, reports some nausea, occ diarrhea and a ill-defined abdominal pain. Her weight fluctuates, no blood in the stools.  Endocrine: No polyphagia, no polyuria , no polydipsia  GU: No dysuria, gross hematuria, difficulty urinating. No urinary urgency, no frequency.  Musculoskeletal: Developed shoulder pain, was seen at another office, on mobic and muscle relaxants, feeling better.  Skin: No change in the color of the skin, palor , no  Rash  Allergic, immunologic: No environmental allergies , no  food allergies  Neurological: No dizziness no  syncope. Headache is slightly more frequent lately but not more severe. No diplopia, no slurred, no slurred speech, no motor deficits, no facial  Numbness  Hematological: No enlarged lymph nodes, no easy bruising , no unusual bleedings  Psychiatry: No suicidal ideas, no hallucinations, no beavior problems, no confusion.  No unusual/severe anxiety, no depression    Past Medical History  Diagnosis Date  . Seasonal allergies     rhinitis  . Anemia     PMH of  . Hypothyroid     hypothyroidism  . Hyperlipidemia 07/20/2010    LDL 150, HDL 37.9  . Nonspecific elevation of levels of transaminase or lactic acid dehydrogenase (LDH) 07/20/2010    PMH of; ALT 47   . Abnormal pap     9/11Hx  abn.pap/colpo--bx neg, 3/14 ASCUS +HPV no colpo due to age  . Migraine headache with aura 03/13/2013    See 03/13/13 throbbing left frontal headache with radiation to the occiput. Possible prodrome of flushing. Oral and blurred vision and photosensitivity.  Family history of migraines in her mother   . Wrist fracture, right at age 3    no surgery  . Hashimoto's thyroiditis   . Celiac disease 09/26/2014  . Depression     Past Surgical History  Procedure Laterality Date  . Wisdom tooth extraction    . Knee arthroscopy  2012    L; Dr Shelle Iron  . Colposcopy      2011 neg  . Esophagogastroduodenoscopy  06-2014    Dr Bosie Clos    History   Social History  . Marital Status: Single    Spouse Name: N/A  . Number of Children: 0  . Years of Education: N/A   Occupational History  . 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 Smoking status: Never Smoker   . Smokeless tobacco: Never Used  . Alcohol Use: No  . Drug Use: No  . Sexual Activity:    Partners: Male    Birth Control/ Protection: Condom   Other Topics Concern  . Not on file   Social History Narrative   Lives w/ parents    Going back to school -- [employer], biology     Family History  Problem Relation  Age of Onset  . Depression Mother     ? onset in mid 4640s  . Hypertension Mother   . Asthma Father   . Diabetes Father   . Thyroid nodules Father     S/P thyroidectomy  . Heart disease Father     S/P defibrillator  . Cancer Maternal Grandmother     ?? female cancer  . Cancer Maternal Grandfather     esophageal  . Thyroid nodules Paternal Grandmother   . Colon cancer Neg Hx   . Breast cancer Neg Hx        Medication List       This list is accurate as of: 02/21/15 11:59 PM.  Always use your most recent med list.               cetirizine 10 MG tablet  Commonly known as:  ZYRTEC  Take 10 mg by mouth daily.     dicyclomine 10 MG capsule  Commonly known as:  BENTYL  Take 1 before meals  and at bedtime as necessary for nausea, vomiting, and diarrhea     EXCEDRIN ASPIRIN FREE PO  Take by mouth.     fluticasone 50 MCG/ACT nasal spray  Commonly known as:  FLONASE  Place 1 spray into both nostrils daily.     levothyroxine 200 MCG tablet  Commonly known as:  SYNTHROID, LEVOTHROID  Take 1 tablet (200 mcg total) by mouth daily before breakfast.     meloxicam 7.5 MG tablet  Commonly known as:  MOBIC  Take 1 tablet (7.5 mg total) by mouth daily. As needed.     metaxalone 800 MG tablet  Commonly known as:  SKELAXIN  Take 0.5-1 tablets (400-800 mg total) by mouth 3 (three) times daily.     multivitamin with minerals tablet  Take 1 tablet by mouth daily.     ondansetron 4 MG disintegrating tablet  Commonly known as:  ZOFRAN-ODT  Take 4 mg by mouth every 8 (eight) hours as needed for nausea or vomiting.     Polyethyl Glycol-Propyl Glycol 0.4-0.3 % Soln  Apply 2 drops to eye daily. Systane Eye Drops     PROBIOTIC DAILY PO  Take 1 tablet by mouth daily.     promethazine 25 MG tablet  Commonly known as:  PHENERGAN  Take 1 tablet (25 mg total) by mouth every 8 (eight) hours as needed for nausea or vomiting.           Objective:   Physical Exam BP 118/78 mmHg  Pulse 63  Temp(Src) 97.8 F (36.6 C) (Oral)  Ht 5\' 8"  (1.727 m)  Wt 249 lb 2 oz (113.002 kg)  BMI 37.89 kg/m2  SpO2 99%  LMP 02/19/2015 (Exact Date) General:   Well developed, well nourished . NAD.  Neck:  Full range of motion. Supple. No  Thyromegaly  HEENT:  Normocephalic . Face symmetric, atraumatic Lungs:  CTA B Normal respiratory effort, no intercostal retractions, no accessory muscle use. Heart: RRR,  no murmur.  No pretibial edema bilaterally  Abdomen:  Not distended, soft, non-tender. No rebound or rigidity. No mass,organomegaly Skin: Exposed areas without rash. Not pale. Not jaundice Neurologic:  alert & oriented X3.  Speech normal, gait appropriate for age and unassisted Strength  symmetric and appropriate for age.  Psych: Cognition and judgment appear intact.  Cooperative with normal attention span and concentration.  Behavior appropriate. No anxious or depressed appearing.       Assessment & Plan:

## 2015-02-21 NOTE — Assessment & Plan Note (Addendum)
Slightly more frequent sx, still headaches are less than once a month, recommend to check and avoid triggers if they are identified

## 2015-02-25 ENCOUNTER — Encounter: Payer: Self-pay | Admitting: Internal Medicine

## 2015-02-26 ENCOUNTER — Other Ambulatory Visit: Payer: Self-pay

## 2015-02-26 DIAGNOSIS — K900 Celiac disease: Secondary | ICD-10-CM

## 2015-02-28 ENCOUNTER — Ambulatory Visit (HOSPITAL_BASED_OUTPATIENT_CLINIC_OR_DEPARTMENT_OTHER)
Admission: RE | Admit: 2015-02-28 | Discharge: 2015-02-28 | Disposition: A | Discharge status: Home / Self Care | Payer: 59 | Source: Ambulatory Visit | Attending: Internal Medicine | Admitting: Internal Medicine

## 2015-02-28 DIAGNOSIS — R109 Unspecified abdominal pain: Secondary | ICD-10-CM | POA: Diagnosis not present

## 2015-02-28 DIAGNOSIS — R112 Nausea with vomiting, unspecified: Secondary | ICD-10-CM | POA: Diagnosis not present

## 2015-03-29 ENCOUNTER — Encounter: Payer: Self-pay | Admitting: Internal Medicine

## 2015-04-11 ENCOUNTER — Other Ambulatory Visit (HOSPITAL_COMMUNITY): Payer: Self-pay | Admitting: Gastroenterology

## 2015-04-11 DIAGNOSIS — R1011 Right upper quadrant pain: Secondary | ICD-10-CM

## 2015-04-25 ENCOUNTER — Ambulatory Visit (HOSPITAL_COMMUNITY)
Admission: RE | Admit: 2015-04-25 | Discharge: 2015-04-25 | Disposition: A | Discharge status: Home / Self Care | Payer: 59 | Source: Ambulatory Visit | Attending: Gastroenterology | Admitting: Gastroenterology

## 2015-04-25 DIAGNOSIS — R1011 Right upper quadrant pain: Secondary | ICD-10-CM | POA: Insufficient documentation

## 2015-04-25 MED ORDER — SINCALIDE 5 MCG IJ SOLR
0.0200 ug/kg | Freq: Once | INTRAMUSCULAR | Status: AC
Start: 2015-04-25 — End: 2015-04-25
  Administered 2015-04-25: 5 ug via INTRAVENOUS

## 2015-04-25 MED ORDER — TECHNETIUM TC 99M MEBROFENIN IV KIT
5.0000 | PACK | Freq: Once | INTRAVENOUS | Status: DC | PRN
  Administered 2015-04-25: 5 via INTRAVENOUS
  Filled 2015-04-25: qty 6

## 2015-04-25 MED ORDER — SINCALIDE 5 MCG IJ SOLR
INTRAMUSCULAR | Status: AC
  Administered 2015-04-25: 5 ug via INTRAVENOUS
  Filled 2015-04-25: qty 5

## 2015-06-10 DIAGNOSIS — K589 Irritable bowel syndrome without diarrhea: Secondary | ICD-10-CM

## 2015-06-10 HISTORY — DX: Irritable bowel syndrome, unspecified: K58.9

## 2015-06-10 HISTORY — PX: UPPER GASTROINTESTINAL ENDOSCOPY: SHX188

## 2015-10-09 ENCOUNTER — Ambulatory Visit (INDEPENDENT_AMBULATORY_CARE_PROVIDER_SITE_OTHER): Payer: BLUE CROSS/BLUE SHIELD | Admitting: Physician Assistant

## 2015-10-09 VITALS — BP 140/88 | HR 79 | Temp 98.2°F | Resp 20 | Ht 68.0 in | Wt 263.0 lb

## 2015-10-09 DIAGNOSIS — R030 Elevated blood-pressure reading, without diagnosis of hypertension: Secondary | ICD-10-CM | POA: Diagnosis not present

## 2015-10-09 DIAGNOSIS — IMO0001 Reserved for inherently not codable concepts without codable children: Secondary | ICD-10-CM

## 2015-10-09 DIAGNOSIS — S93401A Sprain of unspecified ligament of right ankle, initial encounter: Secondary | ICD-10-CM

## 2015-10-09 MED ORDER — MELOXICAM 7.5 MG PO TABS: 7.5000 mg | ORAL_TABLET | Freq: Every day | ORAL | Status: DC

## 2015-10-09 NOTE — Patient Instructions (Signed)
Take mobic once a day. Continue ice, rest, elevation. Wear the brace for 1 week. Then graduate to supportive shoe. If in 1 week you are still having a lot of pain with bearing weight, then return and we will do an xray  Watch your blood pressure

## 2015-10-09 NOTE — Progress Notes (Signed)
[facility] [address], [place] [address] [phone]  Date:  10/09/2015   Name:  Gwendolyn [name]   DOB:  [birth year]   MRN:  [id number]  PCP:  Willow Ora, MD    Chief Complaint: Foot Injury   History of Present Illness:  This is a 30 y.o. female with PMH celiac disease, depression, anemia, hypothyroidism, migraines, env allergies who is presenting with a right foot injury. States she went hiking 6 days ago. Does not recall injuring her foot. 2 days later when she was active at work she started having pain in her right lateral foot. Pain with bearing weight. No swelling or bruising. She thinks it is possible that she rolled her ankle while hiking but isn't sure. She has tried icing and taking some leftover mobic which is helping some. She denies weakness, paresthesias. No prior ankle injuries. No prior fractures.  BP elevated here. 170/100 initially, 140/88 on recheck. Pt states her BP is usually 120s/80s which is true on looking back at prior visits. She is feeling ok. No dizziness, cp, sob.  Review of Systems:  Review of Systems See HPI  Patient Active Problem List   Diagnosis Date Noted  . Depression 10/21/2014  . Celiac disease- dyspepsia-- Dr Bosie Clos 09/26/2014  . Annual physical exam 12/21/2013  . Palpitations 12/21/2013  . Migraine headache with aura 03/13/2013  . Unspecified vitamin D deficiency 12/19/2012  . Obesity, unspecified 11/02/2012  . HYPERLIPIDEMIA 07/20/2010  . Hypothyroidism-- Dr Sharl Ma 06/18/2009  . ANEMIA-IRON DEFICIENCY 05/16/2009  . Seasonal allergies 05/16/2009  . SLEEP DISORDER 05/16/2009    Prior to Admission medications   Medication Sig Start Date End Date Taking? Authorizing Provider  Acetaminophen-Caffeine (EXCEDRIN ASPIRIN FREE PO) Take by mouth.   Yes Historical Provider, MD  cetirizine (ZYRTEC) 10 MG tablet Take 10 mg by mouth daily.   Yes Historical Provider, MD  levothyroxine (SYNTHROID, LEVOTHROID) 200 MCG tablet Take 1  tablet (200 mcg total) by mouth daily before breakfast. 12/23/14  Yes Wanda Plump, MD  meloxicam (MOBIC) 7.5 MG tablet Take 1 tablet (7.5 mg total) by mouth daily. As needed. 11/22/14  Yes Shade Flood, MD  Multiple Vitamins-Minerals (MULTIVITAMIN WITH MINERALS) tablet Take 1 tablet by mouth daily.   Yes Historical Provider, MD  ondansetron (ZOFRAN-ODT) 4 MG disintegrating tablet Take 4 mg by mouth every 8 (eight) hours as needed for nausea or vomiting.   Yes Historical Provider, MD  Polyethyl Glycol-Propyl Glycol 0.4-0.3 % SOLN Apply 2 drops to eye daily. Systane Eye Drops   Yes Historical Provider, MD  Probiotic Product (PROBIOTIC DAILY PO) Take 1 tablet by mouth daily.    Yes Historical Provider, MD  fluticasone (FLONASE) 50 MCG/ACT nasal spray Place 1 spray into both nostrils daily. Reported on 10/09/2015    Historical Provider, MD  metaxalone (SKELAXIN) 800 MG tablet Take 0.5-1 tablets (400-800 mg total) by mouth 3 (three) times daily. Patient not taking: Reported on 02/21/2015 12/31/14   Shade Flood, MD  promethazine (PHENERGAN) 25 MG tablet Take 1 tablet (25 mg total) by mouth every 8 (eight) hours as needed for nausea or vomiting. Patient not taking: Reported on 02/21/2015 10/28/13   Godfrey Pick, PA-C    Allergies  Allergen Reactions  . Gluten Meal Diarrhea and Nausea And Vomiting    Past Surgical History  Procedure Laterality Date  . Wisdom tooth extraction    . Knee arthroscopy  2012    L; Dr  Beane  . Colposcopy      2011 neg  . Esophagogastroduodenoscopy  06-2014    Dr Bosie Clos, biopsies c/w celiac disease  . Upper gastrointestinal endoscopy  06-2015    Dr. Bosie Clos, normal oropharynx, normal esophagus    Social History  Substance Use Topics  . Smoking status: Never Smoker   . Smokeless tobacco: Never Used  . Alcohol Use: No    Family History  Problem Relation Age of Onset  . Depression Mother     ? onset in mid 61s  . Hypertension Mother   . Asthma Father    . Diabetes Father   . Thyroid nodules Father     S/P thyroidectomy  . Heart disease Father     S/P defibrillator  . Cancer Maternal Grandmother     ?? female cancer  . Cancer Maternal Grandfather     esophageal  . Thyroid nodules Paternal Grandmother   . Colon cancer Neg Hx   . Breast cancer Neg Hx     Medication list has been reviewed and updated.  Physical Examination:  Physical Exam  Constitutional: She is oriented to person, place, and time. She appears well-developed and well-nourished. No distress.  HENT:  Head: Normocephalic and atraumatic.  Right Ear: Hearing normal.  Left Ear: Hearing normal.  Nose: Nose normal.  Eyes: Conjunctivae and lids are normal. Right eye exhibits no discharge. Left eye exhibits no discharge. No scleral icterus.  Cardiovascular: Normal rate, regular rhythm and normal pulses.   Pulmonary/Chest: Effort normal. No respiratory distress.  Musculoskeletal: Normal range of motion.       Right ankle: She exhibits normal range of motion, no swelling and no ecchymosis. Tenderness (lateral ankle, inferior to lateral malleolus). No lateral malleolus, no medial malleolus, no head of 5th metatarsal and no proximal fibula tenderness found. Achilles tendon normal.       Left ankle: Normal.       Right foot: Normal.       Left foot: Normal.  Strength intact. ROM intact - pain with inversion and eversion Pedal pulses intact and symmetric  Neurological: She is alert and oriented to person, place, and time. She has normal strength. No sensory deficit. Gait (antalgic) abnormal.  Skin: Skin is warm, dry and intact. No lesion and no rash noted.  Psychiatric: She has a normal mood and affect. Her speech is normal and behavior is normal. Thought content normal.    BP 140/88 mmHg  Pulse 79  Temp(Src) 98.2 F (36.8 C) (Oral)  Resp 20  Ht  (1.727 m)  Wt 263 lb (119.296 kg)  BMI 40.00 kg/m2  SpO2 98%  LMP 09/30/2015  Assessment and Plan:  1. Sprain of  ankle, right, initial encounter Suspect sprain. No signs of bony injury, which would be unlikely with MOI. Fit for sweedo brace. Refilled mobic. Counseled on RICE therapy. Return in 1 week if unable to bear weight without pain, would need to do radiograph at that point. - Care order/instruction - meloxicam (MOBIC) 7.5 MG tablet; Take 1 tablet (7.5 mg total) by mouth daily. As needed.  Dispense: 30 tablet; Refill: 0  2. Elevated BP Monitor BP at work and make sure it does not remain elevated. Return if it does.  [place] Brackett, MHS [facility]  10/09/2015

## 2015-11-13 ENCOUNTER — Encounter: Payer: Self-pay | Admitting: Certified Nurse Midwife

## 2015-11-13 ENCOUNTER — Ambulatory Visit (INDEPENDENT_AMBULATORY_CARE_PROVIDER_SITE_OTHER): Payer: BLUE CROSS/BLUE SHIELD | Admitting: Certified Nurse Midwife

## 2015-11-13 VITALS — BP 118/80 | HR 72 | Resp 16 | Ht 68.5 in | Wt 257.0 lb

## 2015-11-13 DIAGNOSIS — Z01419 Encounter for gynecological examination (general) (routine) without abnormal findings: Secondary | ICD-10-CM

## 2015-11-13 DIAGNOSIS — Z0000 Encounter for general adult medical examination without abnormal findings: Secondary | ICD-10-CM

## 2015-11-13 DIAGNOSIS — Z30011 Encounter for initial prescription of contraceptive pills: Secondary | ICD-10-CM | POA: Diagnosis not present

## 2015-11-13 LAB — POCT URINALYSIS DIPSTICK
Bilirubin, UA: NEGATIVE
Glucose, UA: NEGATIVE
KETONES UA: NEGATIVE
LEUKOCYTES UA: NEGATIVE
NITRITE UA: NEGATIVE
Protein, UA: NEGATIVE
RBC UA: NEGATIVE
UROBILINOGEN UA: NEGATIVE
pH, UA: 6

## 2015-11-13 MED ORDER — NORETHINDRONE 0.35 MG PO TABS: 1.0000 | ORAL_TABLET | Freq: Every day | ORAL | Status: DC

## 2015-11-13 NOTE — Patient Instructions (Signed)
General topics  Next pap or exam is  due in 1 year Take a Women's multivitamin Take 1200 mg. of calcium daily - prefer dietary If any concerns in interim to call back  Breast Self-Awareness Practicing breast self-awareness may pick up problems early, prevent significant medical complications, and possibly save your life. By practicing breast self-awareness, you can become familiar with how your breasts look and feel and if your breasts are changing. This allows you to notice changes early. It can also offer you some reassurance that your breast health is good. One way to learn what is normal for your breasts and whether your breasts are changing is to do a breast self-exam. If you find a lump or something that was not present in the past, it is best to contact your caregiver right away. Other findings that should be evaluated by your caregiver include nipple discharge, especially if it is bloody; skin changes or reddening; areas where the skin seems to be pulled in (retracted); or new lumps and bumps. Breast pain is seldom associated with cancer (malignancy), but should also be evaluated by a caregiver. BREAST SELF-EXAM The best time to examine your breasts is 5 7 days after your menstrual period is over.  ExitCare Patient Information 2013 [employer].   Exercise to Stay Healthy Exercise helps you become and stay healthy. EXERCISE IDEAS AND TIPS Choose exercises that:  You enjoy.  Fit into your day. You do not need to exercise really hard to be healthy. You can do exercises at a slow or medium level and stay healthy. You can:  Stretch before and after working out.  Try yoga, Pilates, or tai chi.  Lift weights.  Walk fast, swim, jog, run, climb stairs, bicycle, dance, or rollerskate.  Take aerobic classes. Exercises that burn about 150 calories:  Running 1  miles in 15 minutes.  Playing volleyball for 45 to 60 minutes.  Washing and waxing a car for 45 to 60  minutes.  Playing touch football for 45 minutes.  Walking 1  miles in 35 minutes.  Pushing a stroller 1  miles in 30 minutes.  Playing basketball for 30 minutes.  Raking leaves for 30 minutes.  Bicycling 5 miles in 30 minutes.  Walking 2 miles in 30 minutes.  Dancing for 30 minutes.  Shoveling snow for 15 minutes.  Swimming laps for 20 minutes.  Walking up stairs for 15 minutes.  Bicycling 4 miles in 15 minutes.  Gardening for 30 to 45 minutes.  Jumping rope for 15 minutes.  Washing windows or floors for 45 to 60 minutes. Document Released: 08/28/2010 Document Revised: 10/18/2011 Document Reviewed: 08/28/2010 ExitCare Patient Information 2013 ExitCare[employer].   Other topics ( that may be useful information):    Sexually Transmitted Disease Sexually transmitted disease (STD) refers to any infection that is passed from person to person during sexual activity. This may happen by way of saliva, semen, blood, vaginal mucus, or urine. Common STDs include:  Gonorrhea.  Chlamydia.  Syphilis.  HIV/AIDS.  Genital herpes.  Hepatitis B and C.  Trichomonas.  Human papillomavirus (HPV).  Pubic lice. CAUSES  An STD may be spread by bacteria, virus, or parasite. A person can get an STD by:  Sexual intercourse with an infected person.  Sharing sex toys with an infected person.  Sharing needles with an infected person.  Having intimate contact with the genitals, mouth, or rectal areas of an infected person. SYMPTOMS  Some people may not have any symptoms, but   they can still pass the infection to others. Different STDs have different symptoms. Symptoms include:  Painful or bloody urination.  Pain in the pelvis, abdomen, vagina, anus, throat, or eyes.  Skin rash, itching, irritation, growths, or sores (lesions). These usually occur in the genital or anal area.  Abnormal vaginal discharge.  Penile discharge in men.  Soft, flesh-colored skin growths in the  genital or anal area.  Fever.  Pain or bleeding during sexual intercourse.  Swollen glands in the groin area.  Yellow skin and eyes (jaundice). This is seen with hepatitis. DIAGNOSIS  To make a diagnosis, your caregiver may:  Take a medical history.  Perform a physical exam.  Take a specimen (culture) to be examined.  Examine a sample of discharge under a microscope.  Perform blood test TREATMENT   Chlamydia, gonorrhea, trichomonas, and syphilis can be cured with antibiotic medicine.  Genital herpes, hepatitis, and HIV can be treated, but not cured, with prescribed medicines. The medicines will lessen the symptoms.  Genital warts from HPV can be treated with medicine or by freezing, burning (electrocautery), or surgery. Warts may come back.  HPV is a virus and cannot be cured with medicine or surgery.However, abnormal areas may be followed very closely by your caregiver and may be removed from the cervix, vagina, or vulva through office procedures or surgery. If your diagnosis is confirmed, your recent sexual partners need treatment. This is true even if they are symptom-free or have a negative culture or evaluation. They should not have sex until their caregiver says it is okay. HOME CARE INSTRUCTIONS  All sexual partners should be informed, tested, and treated for all STDs.  Take your antibiotics as directed. Finish them even if you start to feel better.  Only take over-the-counter or prescription medicines for pain, discomfort, or fever as directed by your caregiver.  Rest.  Eat a balanced diet and drink enough fluids to keep your urine clear or pale yellow.  Do not have sex until treatment is completed and you have followed up with your caregiver. STDs should be checked after treatment.  Keep all follow-up appointments, Pap tests, and blood tests as directed by your caregiver.  Only use latex condoms and water-soluble lubricants during sexual activity. Do not use  petroleum jelly or oils.  Avoid alcohol and illegal drugs.  Get vaccinated for HPV and hepatitis. If you have not received these vaccines in the past, talk to your caregiver about whether one or both might be right for you.  Avoid risky sex practices that can break the skin. The only way to avoid getting an STD is to avoid all sexual activity.Latex condoms and dental dams (for oral sex) will help lessen the risk of getting an STD, but will not completely eliminate the risk. SEEK MEDICAL CARE IF:   You have a fever.  You have any new or worsening symptoms. Document Released: 10/16/2002 Document Revised: 10/18/2011 Document Reviewed: 10/23/2010 [facility] Patient Information 2013 [employer].    Domestic Abuse You are being battered or abused if someone close to you hits, pushes, or physically hurts you in any way. You also are being abused if you are forced into activities. You are being sexually abused if you are forced to have sexual contact of any kind. You are being emotionally abused if you are made to feel worthless or if you are constantly threatened. It is important to remember that help is available. No one has the right to abuse you. PREVENTION OF FURTHER  ABUSE  Learn the warning signs of danger. This varies with situations but may include: the use of alcohol, threats, isolation from friends and family, or forced sexual contact. Leave if you feel that violence is going to occur.  If you are attacked or beaten, report it to the police so the abuse is documented. You do not have to press charges. The police can protect you while you or the attackers are leaving. Get the officer's name and badge number and a copy of the report.  Find someone you can trust and tell them what is happening to you: your caregiver, a nurse, clergy member, close friend or family member. Feeling ashamed is natural, but remember that you have done nothing wrong. No one deserves abuse. Document Released:  07/23/2000 Document Revised: 10/18/2011 Document Reviewed: 10/01/2010 ExitCare Patient Information 2013 ExitCare[employer] LLC.    How Much is Too Much Alcohol? Drinking too much alcohol can cause injury, accidents, and health problems. These types of problems can include:   Car crashes.  Falls.  Family fighting (domestic violence).  Drowning.  Fights.  Injuries.  Burns.  Damage to certain organs.  Having a baby with birth defects. ONE DRINK CAN BE TOO MUCH WHEN YOU ARE:  Working.  Pregnant or breastfeeding.  Taking medicines. Ask your doctor.  Driving or planning to drive. If you or someone you know has a drinking problem, get help from a doctor.  Document Released: 05/22/2009 Document Revised: 10/18/2011 Document Reviewed: 05/22/2009 ExitCare Patient Information 2013 ExitCare[employer] LLC.   Smoking Hazards Smoking cigarettes is extremely bad for your health. Tobacco smoke has over 200 known poisons in it. There are over 60 chemicals in tobacco smoke that cause cancer. Some of the chemicals found in cigarette smoke include:   Cyanide.  Benzene.  Formaldehyde.  Methanol (wood alcohol).  Acetylene (fuel used in welding torches).  Ammonia. Cigarette smoke also contains the poisonous gases nitrogen oxide and carbon monoxide.  Cigarette smokers have an increased risk of many serious medical problems and Smoking causes approximately:  90% of all lung cancer deaths in men.  80% of all lung cancer deaths in women.  90% of deaths from chronic obstructive lung disease. Compared with nonsmokers, smoking increases the risk of:  Coronary heart disease by 2 to 4 times.  Stroke by 2 to 4 times.  Men developing lung cancer by 23 times.  Women developing lung cancer by 13 times.  Dying from chronic obstructive lung diseases by 12 times.  . Smoking is the most preventable cause of death and disease in our society.  WHY IS SMOKING ADDICTIVE?  Nicotine is the chemical  agent in tobacco that is capable of causing addiction or dependence.  When you smoke and inhale, nicotine is absorbed rapidly into the bloodstream through your lungs. Nicotine absorbed through the lungs is capable of creating a powerful addiction. Both inhaled and non-inhaled nicotine may be addictive.  Addiction studies of cigarettes and spit tobacco show that addiction to nicotine occurs mainly during the teen years, when young people begin using tobacco products. WHAT ARE THE BENEFITS OF QUITTING?  There are many health benefits to quitting smoking.   Likelihood of developing cancer and heart disease decreases. Health improvements are seen almost immediately.  Blood pressure, pulse rate, and breathing patterns start returning to normal soon after quitting. QUITTING SMOKING   American Lung Association - 1-800-LUNGUSA  American Cancer Society - 1-800-ACS-2345 Document Released: 09/02/2004 Document Revised: 10/18/2011 Document Reviewed: 05/07/2009 ExitCare Patient Information 2013 ExitCare,   LLC.   Stress Management Stress is a state of physical or mental tension that often results from changes in your life or normal routine. Some common causes of stress are:  Death of a loved one.  Injuries or severe illnesses.  Getting fired or changing jobs.  Moving into a new home. Other causes may be:  Sexual problems.  Business or financial losses.  Taking on a large debt.  Regular conflict with someone at home or at work.  Constant tiredness from lack of sleep. It is not just bad things that are stressful. It may be stressful to:  Win the lottery.  Get married.  Buy a new car. The amount of stress that can be easily tolerated varies from person to person. Changes generally cause stress, regardless of the types of change. Too much stress can affect your health. It may lead to physical or emotional problems. Too little stress (boredom) may also become stressful. SUGGESTIONS TO  REDUCE STRESS:  Talk things over with your family and friends. It often is helpful to share your concerns and worries. If you feel your problem is serious, you may want to get help from a professional counselor.  Consider your problems one at a time instead of lumping them all together. Trying to take care of everything at once may seem impossible. List all the things you need to do and then start with the most important one. Set a goal to accomplish 2 or 3 things each day. If you expect to do too many in a single day you will naturally fail, causing you to feel even more stressed.  Do not use alcohol or drugs to relieve stress. Although you may feel better for a short time, they do not remove the problems that caused the stress. They can also be habit forming.  Exercise regularly - at least 3 times per week. Physical exercise can help to relieve that "uptight" feeling and will relax you.  The shortest distance between despair and hope is often a good night's sleep.  Go to bed and get up on time allowing yourself time for appointments without being rushed.  Take a short "time-out" period from any stressful situation that occurs during the day. Close your eyes and take some deep breaths. Starting with the muscles in your face, tense them, hold it for a few seconds, then relax. Repeat this with the muscles in your neck, shoulders, hand, stomach, back and legs.  Take good care of yourself. Eat a balanced diet and get plenty of rest.  Schedule time for having fun. Take a break from your daily routine to relax. HOME CARE INSTRUCTIONS   Call if you feel overwhelmed by your problems and feel you can no longer manage them on your own.  Return immediately if you feel like hurting yourself or someone else. Document Released: 01/19/2001 Document Revised: 10/18/2011 Document Reviewed: 09/11/2007 ExitCare Patient Information 2013 ExitCare, LLC.   

## 2015-11-13 NOTE — Progress Notes (Signed)
30 y.o. G0P0000 Single  Caucasian Fe here for annual exam. Periods normal, with duration 3-7 days, no issues. Sexually active, no partner change, no STD screening.  Still struggling with glutein free diet adjustment, working with GI regarding. Sees PCP for migraine management and Endocrine for Hypothyroid management. She is on two different doses of Synthroid now. Has changed jobs and now working at [employer] for [employer]. Less stress and better hours! No other health issues today.  Patient's last menstrual period was 10/20/2015.          Sexually active: Yes.    The current method of family planning is condoms most of the time.    Exercising: Yes.    walking & zumba Smoker:  no  Health Maintenance: Pap:  11-06-13 neg MMG:  none Colonoscopy:  None, endoscopy 2016 BMD:   none TDaP:  2009 Shingles: no Pneumonia: no Hep C and HIV: HIV neg 2016 Labs: poct urine-neg ph 6.0 Self breast exam: done occ   reports that she has never smoked. She has never used smokeless tobacco. She reports that she does not drink alcohol or use illicit drugs.  Past Medical History  Diagnosis Date  . Seasonal allergies     rhinitis  . Anemia     PMH of  . Hypothyroid     hypothyroidism  . Hyperlipidemia 07/20/2010    LDL 150, HDL 37.9  . Nonspecific elevation of levels of transaminase or lactic acid dehydrogenase (LDH) 07/20/2010    PMH of; ALT 47   . Abnormal pap     9/11Hx abn.pap/colpo--bx neg, 3/14 ASCUS +HPV no colpo due to age  . Migraine headache with aura 03/13/2013    See 03/13/13 throbbing left frontal headache with radiation to the occiput. Possible prodrome of flushing. Oral and blurred vision and photosensitivity.  Family history of migraines in her mother   . Wrist fracture, right at age 89    no surgery  . Hashimoto's thyroiditis     Dr. Sharl MaKerr  . Celiac disease 09/26/2014  . Depression   . IBS (irritable bowel syndrome) 06/2015    Dr. Bosie ClosSchooler    Past Surgical History  Procedure  Laterality Date  . Wisdom tooth extraction    . Knee arthroscopy  2012    L; Dr Shelle IronBeane  . Colposcopy      2011 neg  . Esophagogastroduodenoscopy  06-2014    Dr Bosie ClosSchooler, biopsies c/w celiac disease  . Upper gastrointestinal endoscopy  06-2015    Dr. Bosie ClosSchooler, normal oropharynx, normal esophagus    Current Outpatient Prescriptions  Medication Sig Dispense Refill  . Acetaminophen-Caffeine (EXCEDRIN ASPIRIN FREE PO) Take by mouth.    . cetirizine (ZYRTEC) 10 MG tablet Take 10 mg by mouth daily.    . fluticasone (FLONASE) 50 MCG/ACT nasal spray Place 1 spray into both nostrils daily. Reported on 10/09/2015    . levothyroxine (SYNTHROID, LEVOTHROID) 100 MCG tablet TAKE 1 TABLET BY MOUTH DAILY ON AN EMPTY STOMACH BEFORE BREAKFAST ALONG WITH 112 MCG TABLET.  5  . levothyroxine (SYNTHROID, LEVOTHROID) 112 MCG tablet TAKE 1 TABLET BY MOUTH DAILY ON AN EMPTY STOMACH 30MINS BEFORE BREAKFAST ALONG WITH 100 MCG TABLET  5  . Multiple Vitamins-Minerals (MULTIVITAMIN WITH MINERALS) tablet Take 1 tablet by mouth daily.    Bertram Gala. Polyethyl Glycol-Propyl Glycol 0.4-0.3 % SOLN Apply 2 drops to eye daily. Systane Eye Drops    . Probiotic Product (PROBIOTIC DAILY PO) Take 1 tablet by mouth daily.     .Marland Kitchen  meloxicam (MOBIC) 7.5 MG tablet Take 1 tablet (7.5 mg total) by mouth daily. As needed. (Patient not taking: Reported on 11/13/2015) 30 tablet 0  . ondansetron (ZOFRAN-ODT) 4 MG disintegrating tablet Take 4 mg by mouth every 8 (eight) hours as needed for nausea or vomiting. Reported on 11/13/2015     No current facility-administered medications for this visit.    Family History  Problem Relation Age of Onset  . Depression Mother     ? onset in mid 39s  . Hypertension Mother   . Asthma Father   . Diabetes Father   . Thyroid nodules Father     S/P thyroidectomy  . Heart disease Father     S/P defibrillator  . Cancer Maternal Grandmother     ?? female cancer  . Cancer Maternal Grandfather     esophageal  .  Thyroid nodules Paternal Grandmother   . Colon cancer Neg Hx   . Breast cancer Neg Hx     ROS:  Pertinent items are noted in HPI.  Otherwise, a comprehensive ROS was negative.  Exam:   BP 118/80 mmHg  Pulse 72  Resp 16  Ht 5' 8.5" (1.74 m)  Wt 257 lb (116.574 kg)  BMI 38.50 kg/m2  LMP 10/20/2015 Height: 5' 8.5" (174 cm) Ht Readings from Last 3 Encounters:  11/13/15 5' 8.5" (1.74 m)  10/09/15  (1.727 m)  02/21/15  (1.727 m)    General appearance: alert, cooperative and appears stated age Head: Normocephalic, without obvious abnormality, atraumatic Neck: no adenopathy, supple, symmetrical, trachea midline and thyroid normal to inspection and palpation Lungs: clear to auscultation bilaterally Breasts: normal appearance, no masses or tenderness, No nipple retraction or dimpling, No nipple discharge or bleeding, No axillary or supraclavicular adenopathy Heart: regular rate and rhythm Abdomen: soft, non-tender; no masses,  no organomegaly Extremities: extremities normal, atraumatic, no cyanosis or edema Skin: Skin color, texture, turgor normal. No rashes or lesions Lymph nodes: Cervical, supraclavicular, and axillary nodes normal. No abnormal inguinal nodes palpated Neurologic: Grossly normal   Pelvic: External genitalia:  no lesions              Urethra:  normal appearing urethra with no masses, tenderness or lesions              Bartholin's and Skene's: normal                 Vagina: normal appearing vagina with normal color and discharge, no lesions              Cervix: normal, non tender, no lesions              Pap taken: No. Bimanual Exam:  Uterus:  normal size, contour, position, consistency, mobility, non-tender              Adnexa: normal adnexa and no mass, fullness, tenderness               Rectovaginal: Confirms               Anus:  normal sphincter tone, no lesions  Chaperone present: yes  A:  Well Woman with normal exam   Contraception condoms  desires OCP  Celiac disease under control with diet and GI management  Hypothyroid now on two medication doses with Endocrine management  Migraine headache with aura much better with just OTC med use    P:   Reviewed health and wellness pertinent to exam  Discussed risks and  benefits of OCP, but can only use POP with history of Migraine Headache with aura. Discussed bleeding profile expectations with POP use. Discussed other progesterone option of Depo Provera, IUD or Nexplanon. 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 Would like trial of POP. Discussed importance of consistent use with in 2 hour period daily for contraception, encouraged condom use also. Questions addressed. Patient will advise if any concerns.  Rx Camilla see order, instructed to start on first day of menses  Continue follow up with MD as indicated.  Pap smear as above not taken   counseled on breast self exam, STD prevention, HIV risk factors and prevention, use and side effects of OCP's, adequate intake of calcium and vitamin D, diet and exercise  return annually or prn  An After Visit Summary was printed and given to the patient.

## 2015-11-18 NOTE — Progress Notes (Signed)
Encounter reviewed Jill Jertson, MD   

## 2016-01-21 ENCOUNTER — Encounter: Payer: Self-pay | Admitting: Internal Medicine

## 2016-01-21 ENCOUNTER — Ambulatory Visit (INDEPENDENT_AMBULATORY_CARE_PROVIDER_SITE_OTHER): Payer: BLUE CROSS/BLUE SHIELD | Admitting: Internal Medicine

## 2016-01-21 VITALS — BP 122/78 | HR 76 | Temp 97.6°F | Ht 68.5 in | Wt 259.5 lb

## 2016-01-21 DIAGNOSIS — J209 Acute bronchitis, unspecified: Secondary | ICD-10-CM | POA: Diagnosis not present

## 2016-01-21 DIAGNOSIS — J0100 Acute maxillary sinusitis, unspecified: Secondary | ICD-10-CM | POA: Diagnosis not present

## 2016-01-21 MED ORDER — AZITHROMYCIN 250 MG PO TABS: ORAL_TABLET | ORAL | Status: DC

## 2016-01-21 MED ORDER — AZELASTINE HCL 0.1 % NA SOLN: 2.0000 | Freq: Every evening | NASAL | Status: DC | PRN

## 2016-01-21 NOTE — Progress Notes (Signed)
Subjective:    Patient ID: Gwendolyn [name], female    DOB: [birth year], 30 y.o.   MRN: [id number]  DOS:  01/21/2016 Type of visit - description : Acute visit Interval history: Symptoms started last week with sore throat, then she started to feel progressively worse with malaise, cough, taking OTCs like Tylenol without much help   Review of Systems Denies chills, + subjective fever + Watery eyes, congested nose, bilateral sinus pain. + Nausea a few times no vomiting. Occasional chest pain anteriorly with cough, has a minimal amount of sputum, color? Mild headache   Past Medical History  Diagnosis Date  . Seasonal allergies     rhinitis  . Anemia     PMH of  . Hypothyroid     hypothyroidism  . Hyperlipidemia 07/20/2010    LDL 150, HDL 37.9  . Nonspecific elevation of levels of transaminase or lactic acid dehydrogenase (LDH) 07/20/2010    PMH of; ALT 47   . Abnormal pap     9/11Hx abn.pap/colpo--bx neg, 3/14 ASCUS +HPV no colpo due to age  . Migraine headache with aura 03/13/2013    See 03/13/13 throbbing left frontal headache with radiation to the occiput. Possible prodrome of flushing. Oral and blurred vision and photosensitivity.  Family history of migraines in her mother   . Wrist fracture, right at age 89    no surgery  . Hashimoto's thyroiditis     Dr. Sharl MaKerr  . Celiac disease 09/26/2014  . Depression   . IBS (irritable bowel syndrome) 06/2015    Dr. Bosie ClosSchooler    Past Surgical History  Procedure Laterality Date  . Wisdom tooth extraction    . Knee arthroscopy  2012    L; Dr Shelle IronBeane  . Colposcopy      2011 neg  . Esophagogastroduodenoscopy  06-2014    Dr Bosie ClosSchooler, biopsies c/w celiac disease  . Upper gastrointestinal endoscopy  06-2015    Dr. Bosie ClosSchooler, normal oropharynx, normal esophagus    Social History   Social History  . Marital Status: Single    Spouse Name: N/A  . Number of Children: 0  . Years of Education: N/A   Occupational History  . 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 Smoking status: Never Smoker   . Smokeless tobacco: Never Used  . Alcohol Use: No  . Drug Use: No  . Sexual Activity:    Partners: Male    Birth Control/ Protection: Condom   Other Topics Concern  . Not on file   Social History Narrative   Lives w/ parents    Going back to school -- [employer], biology        Medication List       This list is accurate as of: 01/21/16 11:59 PM.  Always use your most recent med list.               azelastine 0.1 % nasal spray  Commonly known as:  ASTELIN  Place 2 sprays into both nostrils at bedtime as needed for rhinitis. Use in each nostril as directed     azithromycin 250 MG tablet  Commonly known as:  ZITHROMAX Z-PAK  2 tabs a day the first day, then 1 tab a day x 4 days     cetirizine 10 MG tablet  Commonly known as:  ZYRTEC  Take 10 mg by mouth daily.     EXCEDRIN ASPIRIN FREE PO  Take by mouth.  fluticasone 50 MCG/ACT nasal spray  Commonly known as:  FLONASE  Place 1 spray into both nostrils daily. Reported on 10/09/2015     hyoscyamine 0.125 MG SL tablet  Commonly known as:  LEVSIN SL  Place 0.125 mg under the tongue every 4 (four) hours as needed.     levothyroxine 100 MCG tablet  Commonly known as:  SYNTHROID, LEVOTHROID  TAKE 1 TABLET BY MOUTH DAILY ON AN EMPTY STOMACH BEFORE BREAKFAST ALONG WITH 112 MCG TABLET.     levothyroxine 112 MCG tablet  Commonly known as:  SYNTHROID, LEVOTHROID  TAKE 1 TABLET BY MOUTH DAILY ON AN EMPTY STOMACH BEFORE BREAKFAST ALONG WITH 100 MCG TABLET     multivitamin with minerals tablet  Take 1 tablet by mouth daily.     norethindrone 0.35 MG tablet  Commonly known as:  MICRONOR,CAMILA,ERRIN  Take 1 tablet (0.35 mg total) by mouth daily.     Polyethyl Glycol-Propyl Glycol 0.4-0.3 % Soln  Apply 2 drops to eye daily. Systane Eye Drops     PROBIOTIC DAILY PO  Take 1 tablet by mouth daily.           Objective:    Physical Exam BP 122/78 mmHg  Pulse 76  Temp(Src) 97.6 F (36.4 C) (Oral)  Ht 5' 8.5" (1.74 m)  Wt 259 lb 8 oz (117.708 kg)  BMI 38.88 kg/m2  SpO2 99%  LMP 01/15/2016 (Exact Date) General:   Well developed, well nourished . NAD.  HEENT:  Normocephalic . Face symmetric, atraumatic Left TM slightly red, slightly bulge. Right TM obscured by wax, was able to only partially remove it with a spoon Nose quite congested, sinuses TTP worse on the left maxillary area Lungs:  Few rhonchi with cough, no crackles Normal respiratory effort, no intercostal retractions, no accessory muscle use. Heart: RRR,  no murmur.  No pretibial edema bilaterally  Skin: Not pale. Not jaundice Neurologic:  alert & oriented X3.  Speech normal, gait appropriate for age and unassisted Psych--  Cognition and judgment appear intact.  Cooperative with normal attention span and concentration.  Behavior appropriate. No anxious or depressed appearing.      Assessment & Plan:   Assessment Hypothyroidism, h/o Hashimoto's, Dr Sharl Ma Hyperlipidemia Depression Migraines Vitamin D deficiency GI ---Celiac disease ---IBS Dr. Bosie Clos Abnormal Pap smears, colpo, BX negative, 10-2012 ASCUS and +-HPV  PLAN: Sinusitis/bronchitis : sx c/w sinusitis, bronchitis and also mild ear infection. Plan: See instructions Encourage to use peroxide on the  R ear for a few days to clear the impaction.

## 2016-01-21 NOTE — Progress Notes (Signed)
Pre visit review using our clinic review tool, if applicable. No additional management support is needed unless otherwise documented below in the visit note. 

## 2016-01-21 NOTE — Patient Instructions (Signed)
Rest, fluids , tylenol  For cough:  Take Mucinex DM twice a day as needed until better  For nasal congestion: Use OTC Nasocort or Flonase : 2 nasal sprays on each side of the nose in the morning until you feel better Use ASTELIN a prescribed spray : 2 nasal sprays on each side of the nose at night until you feel better    Take the antibiotic as prescribed  (zithromax)  Call if not gradually better over the next  10 days  Call anytime if the symptoms are severe

## 2016-01-22 DIAGNOSIS — Z09 Encounter for follow-up examination after completed treatment for conditions other than malignant neoplasm: Secondary | ICD-10-CM | POA: Insufficient documentation

## 2016-01-22 NOTE — Assessment & Plan Note (Signed)
Sinusitis/bronchitis : sx c/w sinusitis, bronchitis and also mild ear infection. Plan: See instructions Encourage to use peroxide on the  R ear for a few days to clear the impaction.

## 2016-02-24 ENCOUNTER — Encounter: Payer: BLUE CROSS/BLUE SHIELD | Admitting: Internal Medicine

## 2016-04-07 ENCOUNTER — Encounter: Payer: Self-pay | Admitting: Internal Medicine

## 2016-05-05 ENCOUNTER — Encounter: Payer: Self-pay | Admitting: Internal Medicine

## 2016-05-05 ENCOUNTER — Ambulatory Visit (INDEPENDENT_AMBULATORY_CARE_PROVIDER_SITE_OTHER): Payer: BLUE CROSS/BLUE SHIELD | Admitting: Internal Medicine

## 2016-05-05 VITALS — BP 118/78 | HR 78 | Temp 98.0°F | Resp 14 | Ht 69.0 in | Wt 260.5 lb

## 2016-05-05 DIAGNOSIS — Z0000 Encounter for general adult medical examination without abnormal findings: Secondary | ICD-10-CM

## 2016-05-05 DIAGNOSIS — R002 Palpitations: Secondary | ICD-10-CM

## 2016-05-05 MED ORDER — TOPIRAMATE 25 MG PO TABS: ORAL_TABLET | ORAL | 1 refills | Status: DC

## 2016-05-05 NOTE — Patient Instructions (Addendum)
GO TO THE LAB : Get the blood work     GO TO THE FRONT DESK Schedule your next appointment for a   checkup in 2 months 

## 2016-05-05 NOTE — Progress Notes (Signed)
Subjective:    Patient ID: Eulogio BearAmy R Reta, female    DOB: Jul 31, 1986, 30 y.o.   MRN: [id number]  DOS:  05/05/2016 Type of visit - description : cpx Interval history: Good med compliance Migraines has increasing frequency compared to previous year. The headache per se is similar to previous migraines and sometimes associated with nausea, photo and phonophobia. She reach for Excedrin ~ once a week, she had to leave work twice in the last 6 months.  Also in the last 6 months has 2 episodes of dizziness, palpitations, feeling presyncopal. There was no associated chest pain, difficulty breathing, nausea. She felt slightly clammy. No LOC. Does not recall if at the time was taken any decongestants or OTCs.  Review of Systems  Constitutional: No fever. No chills. No unexplained wt changes. No unusual sweats  HEENT: No dental problems, no ear discharge, no facial swelling, no voice changes. No eye discharge, no eye  redness , no  intolerance to light   Respiratory: No wheezing , no  difficulty breathing. No cough , no mucus production  Cardiovascular: No CP, no leg swelling , no  Palpitations  GI: no nausea, no vomiting, no diarrhea , no  abdominal pain.  No blood in the stools. No dysphagia, no odynophagia    Endocrine: No polyphagia, no polyuria , no polydipsia  GU: No dysuria, gross hematuria, difficulty urinating. No urinary urgency, no frequency.  Musculoskeletal: No joint swellings or unusual aches or pains  Skin: No change in the color of the skin, palor , no  Rash  Allergic, immunologic: No environmental allergies , no  food allergies  Neurological:    No diplopia, no slurred, no slurred speech, no motor deficits, no facial  Numbness  Hematological: No enlarged lymph nodes, no easy bruising , no unusual bleedings  Psychiatry: No suicidal ideas, no hallucinations, no beavior problems, no confusion.  No unusual/severe anxiety, no depression   Past Medical History:  Diagnosis  Date  . Abnormal pap    9/11Hx abn.pap/colpo--bx neg, 3/14 ASCUS +HPV no colpo due to age  . Anemia    PMH of  . Celiac disease 09/26/2014  . Depression   . Hashimoto's thyroiditis    Dr. Sharl MaKerr  . Hyperlipidemia 07/20/2010   LDL 150, HDL 37.9  . Hypothyroid    hypothyroidism  . IBS (irritable bowel syndrome) 06/2015   Dr. Bosie ClosSchooler  . Migraine headache with aura 03/13/2013   See 03/13/13 throbbing left frontal headache with radiation to the occiput. Possible prodrome of flushing. Oral and blurred vision and photosensitivity.  Family history of migraines in her mother   . Nonspecific elevation of levels of transaminase or lactic acid dehydrogenase (LDH) 07/20/2010   PMH of; ALT 47   . Seasonal allergies    rhinitis  . Wrist fracture, right at age 89   no surgery    Past Surgical History:  Procedure Laterality Date  . COLPOSCOPY     2011 neg  . ESOPHAGOGASTRODUODENOSCOPY  06-2014   Dr Bosie ClosSchooler, biopsies c/w celiac disease  . KNEE ARTHROSCOPY  2012   L; Dr Shelle IronBeane  . UPPER GASTROINTESTINAL ENDOSCOPY  06-2015   Dr. Bosie ClosSchooler, normal oropharynx, normal esophagus  . WISDOM TOOTH EXTRACTION      Family History  Problem Relation Age of Onset  . Depression Mother     ? onset in mid 7140s  . Hypertension Mother   . Asthma Father   . Diabetes Father   . Thyroid nodules Father  S/P thyroidectomy  . Heart disease Father     S/P defibrillator  . Cancer Maternal Grandmother     ?? female cancer  . Cancer Maternal Grandfather     esophageal  . Thyroid nodules Paternal Grandmother   . Colon cancer Neg Hx   . Breast cancer Neg Hx     Social History   Social History  . Marital status: Single    Spouse name: N/A  . Number of children: 0  . Years of education: N/A   Occupational History  . 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 Smoking status: Never Smoker  . Smokeless tobacco: Never Used  . Alcohol use No  . Drug use: No  . Sexual activity: Yes    Partners:  Male    Birth control/ protection: Condom   Other Topics Concern  . Not on file   Social History Narrative   Lives w/ parents          Medication List       Accurate as of 05/05/16 11:59 PM. Always use your most recent med list.          azelastine 0.1 % nasal spray Commonly known as:  ASTELIN Place 2 sprays into both nostrils at bedtime as needed for rhinitis. Use in each nostril as directed   cetirizine 10 MG tablet Commonly known as:  ZYRTEC Take 10 mg by mouth daily.   EXCEDRIN ASPIRIN FREE PO Take by mouth.   fluticasone 50 MCG/ACT nasal spray Commonly known as:  FLONASE Place 1 spray into both nostrils daily. Reported on 10/09/2015   hyoscyamine 0.125 MG SL tablet Commonly known as:  LEVSIN SL Place 0.125 mg under the tongue every 4 (four) hours as needed.   levothyroxine 175 MCG tablet Commonly known as:  SYNTHROID, LEVOTHROID Take 175 mcg by mouth daily before breakfast.   multivitamin with minerals tablet Take 1 tablet by mouth daily.   norethindrone 0.35 MG tablet Commonly known as:  MICRONOR,CAMILA,ERRIN Take 1 tablet (0.35 mg total) by mouth daily.   Polyethyl Glycol-Propyl Glycol 0.4-0.3 % Soln Apply 2 drops to eye daily. Systane Eye Drops   PROBIOTIC DAILY PO Take 1 tablet by mouth daily.   topiramate 25 MG tablet Commonly known as:  TOPAMAX 1 tab at night x 1 week, then 2 tabs at night for 1 week, then 3 tabs at night          Objective:   Physical Exam BP 118/78 (BP Location: Left Arm, Patient Position: Sitting, Cuff Size: Normal)   Pulse 78   Temp 98 F (36.7 C) (Oral)   Resp 14   Ht 5\' 9"  (1.753 m)   Wt 260 lb 8 oz (118.2 kg)   SpO2 99%   BMI 38.47 kg/m   General:   Well developed, well nourished . NAD.  Neck: No  thyromegaly  HEENT:  Normocephalic . Face symmetric, atraumatic Lungs:  CTA B Normal respiratory effort, no intercostal retractions, no accessory muscle use. Heart: RRR,  no murmur.  No pretibial edema  bilaterally  Abdomen:  Not distended, soft, non-tender. No rebound or rigidity.   Skin: Exposed areas without rash. Not pale. Not jaundice Neurologic:  alert & oriented X3.  Speech normal, gait appropriate for age and unassisted Strength symmetric and appropriate for age. Pupils equal and reactive, EOMI Psych: Cognition and judgment appear intact.  Cooperative with normal attention span and concentration.  Behavior appropriate. No anxious  or depressed appearing.    Assessment & Plan:   Assessment Hypothyroidism, h/o Hashimoto's, Dr Sharl Ma Hyperlipidemia Depression Migraines Vitamin D deficiency GI Dr Bosie Clos  ---Celiac disease ---IBS   Abnormal Pap smears, colpo, BX negative, 10-2012 ASCUS and +-HPV  PLAN: Hypothyroidism: Per endo Depression: Not an issue at this time Migraines: Increasing frequency, same features. Discuss option of preventative medication and she thinks she is ready for that. Start Topamax. See prescription. Palpitations as described above: EKG today within normal and no change from previous. Given infrequent sx I doubt a Holter will help, we talk about possibly doing an echo but the patient prefers to wait which I think is reasonable for now. She will call me if problems. RTC 2 months (to re-eval  HAs)

## 2016-05-05 NOTE — Assessment & Plan Note (Signed)
Td 09 Female care per gyn  Diet and exercise discussed Labs: CMP, FLP and CBC (TSH check and endocrinology)

## 2016-05-05 NOTE — Progress Notes (Signed)
Pre visit review using our clinic review tool, if applicable. No additional management support is needed unless otherwise documented below in the visit note. 

## 2016-05-06 LAB — COMPREHENSIVE METABOLIC PANEL
ALT: 24 U/L (ref 0–35)
AST: 21 U/L (ref 0–37)
Albumin: 4.1 g/dL (ref 3.5–5.2)
Alkaline Phosphatase: 74 U/L (ref 39–117)
BUN: 9 mg/dL (ref 6–23)
CO2: 29 mEq/L (ref 19–32)
Calcium: 9.3 mg/dL (ref 8.4–10.5)
Chloride: 103 mEq/L (ref 96–112)
Creatinine, Ser: 0.78 mg/dL (ref 0.40–1.20)
GFR: 93.29 mL/min (ref >60.00)
Glucose, Bld: 71 mg/dL (ref 70–99)
Potassium: 3.9 mEq/L (ref 3.5–5.1)
Sodium: 139 mEq/L (ref 135–145)
Total Bilirubin: 0.5 mg/dL (ref 0.2–1.2)
Total Protein: 7.5 g/dL (ref 6.0–8.3)

## 2016-05-06 LAB — CBC WITH DIFFERENTIAL/PLATELET
Basophils Absolute: 0 10*3/uL (ref 0.0–0.1)
Basophils Relative: 0.3 % (ref 0.0–3.0)
Eosinophils Absolute: 0.2 10*3/uL (ref 0.0–0.7)
Eosinophils Relative: 1.9 % (ref 0.0–5.0)
HCT: 39.8 % (ref 36.0–46.0)
Hemoglobin: 13.4 g/dL (ref 12.0–15.0)
Lymphocytes Relative: 22 % (ref 12.0–46.0)
Lymphs Abs: 2.2 10*3/uL (ref 0.7–4.0)
MCHC: 33.7 g/dL (ref 30.0–36.0)
MCV: 82.7 fl (ref 78.0–100.0)
Monocytes Absolute: 0.5 10*3/uL (ref 0.1–1.0)
Monocytes Relative: 5.3 % (ref 3.0–12.0)
Neutro Abs: 7.1 10*3/uL (ref 1.4–7.7)
Neutrophils Relative %: 70.5 % (ref 43.0–77.0)
Platelets: 370 10*3/uL (ref 150.0–400.0)
RBC: 4.81 Mil/uL (ref 3.87–5.11)
RDW: 13.1 % (ref 11.5–15.5)
WBC: 10.1 10*3/uL (ref 4.0–10.5)

## 2016-05-06 LAB — LIPID PANEL
Cholesterol: 150 mg/dL (ref 0–200)
HDL: 40.6 mg/dL (ref >39.00)
LDL Cholesterol: 95 mg/dL (ref 0–99)
NonHDL: 108.95
Total CHOL/HDL Ratio: 4
Triglycerides: 70 mg/dL (ref 0.0–149.0)
VLDL: 14 mg/dL (ref 0.0–40.0)

## 2016-05-06 NOTE — Assessment & Plan Note (Signed)
Hypothyroidism: Per endo Depression: Not an issue at this time Migraines: Increasing frequency, same features. Discuss option of preventative medication and she thinks she is ready for that. Start Topamax. See prescription. Palpitations as described above: EKG today within normal and no change from previous. Given infrequent sx I doubt a Holter will help, we talk about possibly doing an echo but the patient prefers to wait which I think is reasonable for now. She will call me if problems. RTC 2 months (to re-eval  HAs)

## 2016-06-05 ENCOUNTER — Encounter: Payer: Self-pay | Admitting: Internal Medicine

## 2016-06-05 DIAGNOSIS — R002 Palpitations: Secondary | ICD-10-CM

## 2016-06-05 DIAGNOSIS — R42 Dizziness and giddiness: Secondary | ICD-10-CM

## 2016-06-14 ENCOUNTER — Ambulatory Visit (INDEPENDENT_AMBULATORY_CARE_PROVIDER_SITE_OTHER): Payer: BLUE CROSS/BLUE SHIELD | Admitting: Physician Assistant

## 2016-06-14 VITALS — BP 124/88 | HR 99 | Temp 98.2°F | Resp 17 | Ht 69.0 in | Wt 257.0 lb

## 2016-06-14 DIAGNOSIS — R52 Pain, unspecified: Secondary | ICD-10-CM

## 2016-06-14 DIAGNOSIS — J029 Acute pharyngitis, unspecified: Secondary | ICD-10-CM | POA: Diagnosis not present

## 2016-06-14 DIAGNOSIS — R059 Cough, unspecified: Secondary | ICD-10-CM

## 2016-06-14 DIAGNOSIS — R05 Cough: Secondary | ICD-10-CM

## 2016-06-14 DIAGNOSIS — R0981 Nasal congestion: Secondary | ICD-10-CM

## 2016-06-14 LAB — POCT CBC
Granulocyte percent: 72.3 %G (ref 37–80)
HCT, POC: 41.6 % (ref 37.7–47.9)
HEMOGLOBIN: 14.6 g/dL (ref 12.2–16.2)
Lymph, poc: 1.8 (ref 0.6–3.4)
MCH: 28.4 pg (ref 27–31.2)
MCHC: 35 g/dL (ref 31.8–35.4)
MCV: 81.2 fL (ref 80–97)
MID (cbc): 0.8 (ref 0–0.9)
MPV: 7.6 fL (ref 0–99.8)
PLATELET COUNT, POC: 364 10*3/uL (ref 142–424)
POC Granulocyte: 6.8 (ref 2–6.9)
POC LYMPH PERCENT: 19.2 %L (ref 10–50)
POC MID %: 8.5 % (ref 0–12)
RBC: 5.13 M/uL (ref 4.04–5.48)
RDW, POC: 13.4 %
WBC: 9.4 10*3/uL (ref 4.6–10.2)

## 2016-06-14 LAB — POCT RAPID STREP A (OFFICE): Rapid Strep A Screen: NEGATIVE

## 2016-06-14 LAB — POCT INFLUENZA A/B
Influenza A, POC: NEGATIVE
Influenza B, POC: NEGATIVE

## 2016-06-14 MED ORDER — IPRATROPIUM BROMIDE 0.03 % NA SOLN: 2.0000 | Freq: Two times a day (BID) | NASAL | 0 refills | Status: DC

## 2016-06-14 MED ORDER — HYDROCOD POLST-CPM POLST ER 10-8 MG/5ML PO SUER: 5.0000 mL | Freq: Two times a day (BID) | ORAL | 0 refills | Status: DC | PRN

## 2016-06-14 MED ORDER — BENZONATATE 100 MG PO CAPS: 100.0000 mg | ORAL_CAPSULE | Freq: Three times a day (TID) | ORAL | 0 refills | Status: DC | PRN

## 2016-06-14 MED ORDER — AMOXICILLIN-POT CLAVULANATE 875-125 MG PO TABS: 1.0000 | ORAL_TABLET | Freq: Two times a day (BID) | ORAL | 0 refills | Status: DC

## 2016-06-14 NOTE — Progress Notes (Signed)
MRN: [id number] DOB: 1986-04-25  Subjective:   Gwendolyn [name] is a 30 y.o. female presenting for chief complaint of Sinus Problem (chest congestion, started Friday) .  Reports 4 day history of sinus congestion, sinus pain, rhinorrhea, itchy watery eyes, sore throat, dry cough (no hemoptysis), chest tightness and myalgia, chills and fatigue. Has tried sudafed with moderate relief. Denies fever, ear pain, ear drainage, difficulty swallowing, wheezing, shortness of breath and chest pain, night sweats, decreased appetite, nausea, vomiting, abdominal pain and diarrhea. 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 Has history of seasonal allergies, no history of asthma. Patient has had flu shot this season. Denies smoking, has minimal alcohol use. Denies any other aggravating or relieving factors, no other questions or concerns.  Gwendolyn [name] has a current medication list which includes the following prescription(s): acetaminophen-caffeine, azelastine, cetirizine, fluticasone, hyoscyamine, levothyroxine, multivitamin with minerals, norethindrone, polyethyl glycol-propyl glycol, probiotic product, topiramate, amoxicillin-clavulanate, benzonatate, chlorpheniramine-hydrocodone, and ipratropium. Also is allergic to gluten meal.  Gwendolyn [name]  has a past medical history of Abnormal pap; Anemia; Celiac disease (09/26/2014); Depression; Hashimoto's thyroiditis; Hyperlipidemia (07/20/2010); Hypothyroid; IBS (irritable bowel syndrome) (06/2015); Migraine headache with aura (03/13/2013); Nonspecific elevation of levels of transaminase or lactic acid dehydrogenase (LDH) (07/20/2010); Seasonal allergies; and Wrist fracture, right (at age 89). Also  has a past surgical history that includes Wisdom tooth extraction; Knee arthroscopy (2012); Colposcopy; Esophagogastroduodenoscopy (06-2014); and Upper gastrointestinal endoscopy (06-2015).   Objective:   Vitals: BP 124/88 (BP Location: Right Arm, Patient Position:  Sitting, Cuff Size: Large)   Pulse 99   Temp 98.2 F (36.8 C) (Oral)   Resp 17   Ht 5\' 9"  (1.753 m)   Wt 257 lb (116.6 kg)   LMP 05/26/2016   SpO2 100%   BMI 37.95 kg/m   Physical Exam  Constitutional: She is oriented to person, place, and time. She appears well-developed and well-nourished. No distress.  HENT:  Head: Normocephalic and atraumatic.  Right Ear: External ear and ear canal normal. There is drainage (cerumen blocking view of TM).  Left Ear: Tympanic membrane, external ear and ear canal normal.  Nose: Mucosal edema and rhinorrhea present. Right sinus exhibits maxillary sinus tenderness. Right sinus exhibits no frontal sinus tenderness. Left sinus exhibits maxillary sinus tenderness. Left sinus exhibits no frontal sinus tenderness.  Mouth/Throat: Uvula is midline and mucous membranes are normal. Posterior oropharyngeal erythema present.  Petechia of soft palate noted.  Eyes: Conjunctivae are normal.  Neck: Normal range of motion.  Cardiovascular: Regular rhythm and normal heart sounds.  Tachycardia present.   Pulmonary/Chest: Effort normal and breath sounds normal.  Lymphadenopathy:       Head (right side): No submental, no submandibular, no tonsillar, no preauricular, no posterior auricular and no occipital adenopathy present.       Head (left side): No submental, no submandibular, no tonsillar, no preauricular, no posterior auricular and no occipital adenopathy present.    She has no cervical adenopathy.       Right: No supraclavicular adenopathy present.       Left: No supraclavicular adenopathy present.  Neurological: She is alert and oriented to person, place, and time.  Skin: Skin is warm and dry.  Psychiatric: She has a normal mood and affect.  Vitals reviewed.  Results for orders placed or performed in visit on 06/14/16 (from the past 24 hour(s))  POCT rapid strep A     Status: None   Collection Time: 06/14/16  3:19  PM  Result Value Ref Range   Rapid Strep A  Screen Negative Negative  POCT Influenza A/B     Status: None   Collection Time: 06/14/16  3:33 PM  Result Value Ref Range   Influenza A, POC Negative Negative   Influenza B, POC Negative Negative  POCT CBC     Status: None   Collection Time: 06/14/16  4:00 PM  Result Value Ref Range   WBC 9.4 4.6 - 10.2 K/uL   Lymph, poc 1.8 0.6 - 3.4   POC LYMPH PERCENT 19.2 10 - 50 %L   MID (cbc) 0.8 0 - 0.9   POC MID % 8.5 0 - 12 %M   POC Granulocyte 6.8 2 - 6.9   Granulocyte percent 72.3 37 - 80 %G   RBC 5.13 4.04 - 5.48 M/uL   Hemoglobin 14.6 12.2 - 16.2 g/dL   HCT, POC 82.941.6 56.237.7 - 47.9 %   MCV 81.2 80 - 97 fL   MCH, POC 28.4 27 - 31.2 pg   MCHC 35.0 31.8 - 35.4 g/dL   RDW, POC 13.013.4 %   Platelet Count, POC 364 142 - 424 K/uL   MPV 7.6 0 - 99.8 fL    Assessment and Plan :  1. Sore throat - POCT rapid strep A - Culture, Group A Strep  2. Body aches - POCT Influenza A/B - POCT CBC  3. Sinus congestion - ipratropium (ATROVENT) 0.03 % nasal spray; Place 2 sprays into both nostrils 2 (two) times daily.  Dispense: 30 mL; Refill: 0 - amoxicillin-clavulanate (AUGMENTIN) 875-125 MG tablet; Take 1 tablet by mouth 2 (two) times daily.  Dispense: 20 tablet; Refill: 0  4. Cough - benzonatate (TESSALON) 100 MG capsule; Take 1-2 capsules (100-200 mg total) by mouth 3 (three) times daily as needed for cough.  Dispense: 40 capsule; Refill: 0 - chlorpheniramine-HYDROcodone (TUSSIONEX PENNKINETIC ER) 10-8 MG/5ML SUER; Take 5 mLs by mouth every 12 (twelve) hours as needed for cough.  Dispense: 100 mL; Refill: 0  -Etiology is likely viral, will treat symptomatically. Pt given print out prescription for Augmentin if she sees no improvement or worsens in 5-7 days. Pt understands and agrees to treatment plan.   Benjiman CoreBrittany Kiran Carline, PA-C  [facility] 06/14/2016 8:51 PM

## 2016-06-14 NOTE — Patient Instructions (Addendum)
-   We will treat this as a respiratory viral infection.  -May use afrin for three days but no longer than that because it can make symptoms worse. -I would recommend continuing the sudaphed.  - I recommend you rest, drink plenty of fluids, eat light meals including soups.  - You may use cough syrup at night for your cough and sore throat, Tessalon pearls during the day. Be aware that cough syrup can definitely make you drowsy and sleepy so do not drive or operate any heavy machinery if it is affecting you during the day.  - You may also use Tylenol or ibuprofen over-the-counter for your sore throat.  -I have printed out a prescription for an antibiotic that you can fill if you are not any better in 5-7 days.    IF you received an x-ray today, you will receive an invoice from [facility]. Please contact [facility] at [phone] with questions or concerns regarding your invoice.   IF you received labwork today, you will receive an invoice from [facility]. Please contact [facility] at [phone] with questions or concerns regarding your invoice.   Our billing staff will not be able to assist you with questions regarding bills from these companies.  You will be contacted with the lab results as soon as they are available. The fastest way to get your results is to activate your My Chart account. Instructions are located on the last page of this paperwork. If you have not heard from us regarding the results in 2 weeks, please contact this office.

## 2016-06-16 LAB — CULTURE, GROUP A STREP: ORGANISM ID, BACTERIA: NORMAL

## 2016-06-25 ENCOUNTER — Other Ambulatory Visit: Payer: Self-pay | Admitting: Internal Medicine

## 2016-06-30 ENCOUNTER — Other Ambulatory Visit: Payer: Self-pay

## 2016-06-30 ENCOUNTER — Ambulatory Visit (HOSPITAL_COMMUNITY): Payer: BLUE CROSS/BLUE SHIELD | Attending: Cardiology

## 2016-06-30 DIAGNOSIS — R42 Dizziness and giddiness: Secondary | ICD-10-CM

## 2016-06-30 DIAGNOSIS — R002 Palpitations: Secondary | ICD-10-CM

## 2016-06-30 DIAGNOSIS — E785 Hyperlipidemia, unspecified: Secondary | ICD-10-CM | POA: Diagnosis not present

## 2016-06-30 DIAGNOSIS — Z8249 Family history of ischemic heart disease and other diseases of the circulatory system: Secondary | ICD-10-CM | POA: Diagnosis not present

## 2016-07-02 ENCOUNTER — Encounter: Payer: Self-pay | Admitting: Internal Medicine

## 2016-07-07 ENCOUNTER — Ambulatory Visit: Payer: Self-pay | Admitting: Internal Medicine

## 2016-07-19 ENCOUNTER — Encounter: Payer: Self-pay | Admitting: Internal Medicine

## 2016-07-19 ENCOUNTER — Ambulatory Visit (INDEPENDENT_AMBULATORY_CARE_PROVIDER_SITE_OTHER): Payer: BLUE CROSS/BLUE SHIELD | Admitting: Internal Medicine

## 2016-07-19 VITALS — BP 122/74 | HR 88 | Temp 98.0°F | Resp 14 | Ht 69.0 in | Wt 259.2 lb

## 2016-07-19 DIAGNOSIS — R002 Palpitations: Secondary | ICD-10-CM

## 2016-07-19 DIAGNOSIS — G43109 Migraine with aura, not intractable, without status migrainosus: Secondary | ICD-10-CM

## 2016-07-19 DIAGNOSIS — F439 Reaction to severe stress, unspecified: Secondary | ICD-10-CM | POA: Diagnosis not present

## 2016-07-19 MED ORDER — TOPIRAMATE 100 MG PO TABS: 100.0000 mg | ORAL_TABLET | Freq: Two times a day (BID) | ORAL | 6 refills | Status: DC

## 2016-07-19 MED ORDER — INDOMETHACIN 50 MG PO CAPS: 50.0000 mg | ORAL_CAPSULE | Freq: Three times a day (TID) | ORAL | 1 refills | Status: DC | PRN

## 2016-07-19 NOTE — Patient Instructions (Addendum)
If you develop a headache:  Rest, drink plenty of fluids, indomethacin  (okay to repeat 8 hours later he needed)  If you have any unusual type of headache or as severe one--  Let us know.  Next visit in 4 months

## 2016-07-19 NOTE — Progress Notes (Signed)
Subjective:    Patient ID: Gwendolyn [name], female    DOB: 01/01/1986, 30 y.o.   MRN: [id number]  DOS:  07/19/2016 Type of visit - description : Follow-up, here with her mother Interval history: HAs: Started Topamax, good compliance and tolerance, frequency has decrease to one episode every 2-3 weeks. Intensity is about the same. Palpitations, occasional dizziness: Still happening, once or twice a week. Occasionally associated with a chest pain (CP sometimes located in the front chest, other times by the side).    Review of Systems No further presyncopal episodes. Admits to stress, work related, no anxiety or depression per se. Her mother stated  she thinks her symptoms are stress-related.  Past Medical History:  Diagnosis Date  . Abnormal pap    9/11Hx abn.pap/colpo--bx neg, 3/14 ASCUS +HPV no colpo due to age  . Anemia    PMH of  . Celiac disease 09/26/2014  . Depression   . Hashimoto's thyroiditis    Dr. Sharl MaKerr  . Hyperlipidemia 07/20/2010   LDL 150, HDL 37.9  . Hypothyroid    hypothyroidism  . IBS (irritable bowel syndrome) 06/2015   Dr. Bosie ClosSchooler  . Migraine headache with aura 03/13/2013   See 03/13/13 throbbing left frontal headache with radiation to the occiput. Possible prodrome of flushing. Oral and blurred vision and photosensitivity.  Family history of migraines in her mother   . Nonspecific elevation of levels of transaminase or lactic acid dehydrogenase (LDH) 07/20/2010   PMH of; ALT 47   . Seasonal allergies    rhinitis  . Wrist fracture, right at age 89   no surgery    Past Surgical History:  Procedure Laterality Date  . COLPOSCOPY     2011 neg  . ESOPHAGOGASTRODUODENOSCOPY  06-2014   Dr Bosie ClosSchooler, biopsies c/w celiac disease  . KNEE ARTHROSCOPY  2012   L; Dr Shelle IronBeane  . UPPER GASTROINTESTINAL ENDOSCOPY  06-2015   Dr. Bosie ClosSchooler, normal oropharynx, normal esophagus  . WISDOM TOOTH EXTRACTION      Social History   Social History  . Marital status: Single   Spouse name: N/A  . Number of children: 0  . Years of education: N/A   Occupational History  . CMA-- works at [employer]     Social History Main Topics  . Smoking status: Never Smoker  . Smokeless tobacco: Never Used  . Alcohol use No  . Drug use: No  . Sexual activity: Yes    Partners: Male    Birth control/ protection: Condom   Other Topics Concern  . Not on file   Social History Narrative   Lives w/ parents          Medication List       Accurate as of 07/19/16 11:59 PM. Always use your most recent med list.          azelastine 0.1 % nasal spray Commonly known as:  ASTELIN Place 2 sprays into both nostrils at bedtime as needed for rhinitis. Use in each nostril as directed   cetirizine 10 MG tablet Commonly known as:  ZYRTEC Take 10 mg by mouth daily.   EXCEDRIN ASPIRIN FREE PO Take by mouth.   fluticasone 50 MCG/ACT nasal spray Commonly known as:  FLONASE Place 1 spray into both nostrils daily. Reported on 10/09/2015   hyoscyamine 0.125 MG SL tablet Commonly known as:  LEVSIN SL Place 0.125 mg under the tongue every 4 (four) hours as needed.   indomethacin 50 MG capsule Commonly known as:  INDOCIN Take 1 capsule (50 mg total) by mouth 3 (three) times daily as needed.   levothyroxine 175 MCG tablet Commonly known as:  SYNTHROID, LEVOTHROID Take 175 mcg by mouth daily before breakfast.   multivitamin with minerals tablet Take 1 tablet by mouth daily.   norethindrone 0.35 MG tablet Commonly known as:  MICRONOR,CAMILA,ERRIN Take 1 tablet (0.35 mg total) by mouth daily.   Polyethyl Glycol-Propyl Glycol 0.4-0.3 % Soln Apply 2 drops to eye daily. Systane Eye Drops   PROBIOTIC DAILY PO Take 1 tablet by mouth daily.   topiramate 100 MG tablet Commonly known as:  TOPAMAX Take 1 tablet (100 mg total) by mouth 2 (two) times daily.          Objective:   Physical Exam BP 122/74 (BP Location: Left Arm, Patient Position: Sitting, Cuff Size: Normal)    Pulse 88   Temp 98 F (36.7 C) (Oral)   Resp 14   Ht 5\' 9"  (1.753 m)   Wt 259 lb 4 oz (117.6 kg)   LMP 07/18/2016 (Exact Date)   SpO2 98%   BMI 38.28 kg/m  General:   Well developed, well nourished . NAD.  HEENT:  Normocephalic . Face symmetric, atraumatic Lungs:  CTA B Normal respiratory effort, no intercostal retractions, no accessory muscle use. Heart: RRR,  no murmur.  No pretibial edema bilaterally  Skin: Not pale. Not jaundice Neurologic:  alert & oriented X3.  Speech normal, gait appropriate for age and unassisted Psych--  Cognition and judgment appear intact.  Cooperative with normal attention span and concentration.  Behavior appropriate. No anxious or depressed appearing.      Assessment & Plan:   Assessment Hypothyroidism, h/o Hashimoto's, Dr Sharl MaKerr Hyperlipidemia Depression Migraines Vitamin D deficiency GI Dr Bosie ClosSchooler  ---Celiac disease ---IBS   Abnormal Pap smears, colpo, BX negative, 10-2012 ASCUS and +-HPV  PLAN: Migraines: Frequency has decreased to one episode every 2 or 3 weeks w/ topamax. In the past Imitrex make HAs worse. Has identified triggers: Stress, lack of sleep and eating onions. Plan: Increase Topamax 100 mg daily. Abortive therapy with indomethacin, avoid triggers. See instructions. Palpitations: Echo completely normal, still have sx, occasional with atypical chest pain. Could be related to stress, see ROS. We agreed to observation, will call if something changes. Stress/anxiety: Extensive discussion about the issue, the patient's mother thinks that she is very stressed and that is causing palpitations etc. Recommend nonpharmacological therapy including relaxation techniques, yoga, mindfulness. RTC 4-6 months

## 2016-07-19 NOTE — Progress Notes (Signed)
Pre visit review using our clinic review tool, if applicable. No additional management support is needed unless otherwise documented below in the visit note. 

## 2016-07-20 NOTE — Assessment & Plan Note (Signed)
Migraines: Frequency has decreased to one episode every 2 or 3 weeks w/ topamax. In the past Imitrex make HAs worse. Has identified triggers: Stress, lack of sleep and eating onions. Plan: Increase Topamax 100 mg daily. Abortive therapy with indomethacin, avoid triggers. See instructions. Palpitations: Echo completely normal, still have sx, occasional with atypical chest pain. Could be related to stress, see ROS. We agreed to observation, will call if something changes. Stress/anxiety: Extensive discussion about the issue, the patient's mother thinks that she is very stressed and that is causing palpitations etc. Recommend nonpharmacological therapy including relaxation techniques, yoga, mindfulness. RTC 4-6 months

## 2016-11-04 ENCOUNTER — Other Ambulatory Visit: Payer: Self-pay | Admitting: Internal Medicine

## 2016-11-16 ENCOUNTER — Ambulatory Visit: Payer: BLUE CROSS/BLUE SHIELD | Admitting: Certified Nurse Midwife

## 2016-11-17 ENCOUNTER — Ambulatory Visit: Payer: Self-pay | Admitting: Internal Medicine

## 2016-11-18 LAB — TSH: TSH: 5.82 u[IU]/mL (ref <=5.90)

## 2016-11-23 ENCOUNTER — Other Ambulatory Visit: Payer: Self-pay | Admitting: Certified Nurse Midwife

## 2016-11-23 DIAGNOSIS — Z30011 Encounter for initial prescription of contraceptive pills: Secondary | ICD-10-CM

## 2016-11-23 NOTE — Telephone Encounter (Signed)
Medication refill request: OCP  Last AEX:  11-13-15  Next AEX: 11-24-16  Last MMG (if hormonal medication request): N/A Refill authorized: please advise

## 2016-11-24 ENCOUNTER — Encounter: Payer: Self-pay | Admitting: Certified Nurse Midwife

## 2016-11-24 ENCOUNTER — Ambulatory Visit (INDEPENDENT_AMBULATORY_CARE_PROVIDER_SITE_OTHER): Payer: BLUE CROSS/BLUE SHIELD | Admitting: Certified Nurse Midwife

## 2016-11-24 VITALS — BP 120/82 | HR 64 | Resp 16 | Ht 68.0 in | Wt 257.0 lb

## 2016-11-24 DIAGNOSIS — Z0000 Encounter for general adult medical examination without abnormal findings: Secondary | ICD-10-CM | POA: Diagnosis not present

## 2016-11-24 DIAGNOSIS — Z124 Encounter for screening for malignant neoplasm of cervix: Secondary | ICD-10-CM | POA: Diagnosis not present

## 2016-11-24 DIAGNOSIS — Z01419 Encounter for gynecological examination (general) (routine) without abnormal findings: Secondary | ICD-10-CM

## 2016-11-24 DIAGNOSIS — Z30011 Encounter for initial prescription of contraceptive pills: Secondary | ICD-10-CM | POA: Diagnosis not present

## 2016-11-24 DIAGNOSIS — Z3041 Encounter for surveillance of contraceptive pills: Secondary | ICD-10-CM

## 2016-11-24 MED ORDER — NORETHINDRONE 0.35 MG PO TABS: 1.0000 | ORAL_TABLET | Freq: Every day | ORAL | 4 refills | Status: AC

## 2016-11-24 NOTE — Progress Notes (Signed)
31 y.o. G0P0000 Single  Caucasian Fe here for annual exam. Periods normal, but some cramping. Using Midol for cramping with good response. Contraception working well but occasional missed pills. Considering Nexplanon or [place] IUD. Serious relationship and planning family at some point, but not now.Continues with PCP and Endocrine for Hypothyroid, Hyperlipidemia, Migraine with aura and GI with Celiac disease. All stable at this point. No STD concerns, but would screening vaginal only. Weight stable, no gain in past 2 years, aware would recommend weight loss prior to planning pregnancy and would need to have other health issues stable at that time. No other health concerns today.  Patient's last menstrual period was 11/07/2016 (exact date).          Sexually active: Yes.    The current method of family planning is oral progesterone-only contraceptive.    Exercising: Yes.    walk dogs Smoker:  no  Health Maintenance: Pap: 11-02-12 ASCUS HPV HR+,  11-06-13 neg, hx of colpo 2011 History of Abnormal Pap: yes MMG:  none Self Breast exams: occ Colonoscopy: none but did endo 2016 BMD:   none TDaP:  2009 Shingles: no Pneumonia: no Hep C and HIV: HIV neg 2016 Labs: none   reports that she has never smoked. She has never used smokeless tobacco. She reports that she does not drink alcohol or use drugs.  Past Medical History:  Diagnosis Date  . Abnormal pap    9/11Hx abn.pap/colpo--bx neg, 3/14 ASCUS +HPV no colpo due to age  . Anemia    PMH of  . Celiac disease 09/26/2014  . Depression   . Hashimoto's thyroiditis    Dr. Sharl Ma  . Hyperlipidemia 07/20/2010   LDL 150, HDL 37.9  . Hypothyroid    hypothyroidism  . IBS (irritable bowel syndrome) 06/2015   Dr. Bosie Clos  . Migraine headache with aura 03/13/2013   See 03/13/13 throbbing left frontal headache with radiation to the occiput. Possible prodrome of flushing. Oral and blurred vision and photosensitivity.  Family history of migraines in her  mother   . Nonspecific elevation of levels of transaminase or lactic acid dehydrogenase (LDH) 07/20/2010   PMH of; ALT 47   . Seasonal allergies    rhinitis  . Wrist fracture, right at age 69   no surgery    Past Surgical History:  Procedure Laterality Date  . COLPOSCOPY     2011 neg  . ESOPHAGOGASTRODUODENOSCOPY  06-2014   Dr Bosie Clos, biopsies c/w celiac disease  . KNEE ARTHROSCOPY  2012   L; Dr Shelle Iron  . UPPER GASTROINTESTINAL ENDOSCOPY  06-2015   Dr. Bosie Clos, normal oropharynx, normal esophagus  . WISDOM TOOTH EXTRACTION      Current Outpatient Prescriptions  Medication Sig Dispense Refill  . azelastine (ASTELIN) 0.1 % nasal spray Place 2 sprays into both nostrils at bedtime as needed for rhinitis. Use in each nostril as directed 30 mL 3  . cetirizine (ZYRTEC) 10 MG tablet Take 10 mg by mouth daily.    . fluticasone (FLONASE) 50 MCG/ACT nasal spray Place 1 spray into both nostrils daily. Reported on 10/09/2015    . hyoscyamine (LEVSIN SL) 0.125 MG SL tablet Place 0.125 mg under the tongue every 4 (four) hours as needed.    . indomethacin (INDOCIN) 50 MG capsule Take 1 capsule (50 mg total) by mouth 3 (three) times daily as needed. 40 capsule 1  . Multiple Vitamins-Minerals (MULTIVITAMIN WITH MINERALS) tablet Take 1 tablet by mouth daily.    . norethindrone (MICRONOR,CAMILA,ERRIN)  0.35 MG tablet TAKE 1 TABLET BY MOUTH DAILY. 84 tablet 0  . Probiotic Product (PROBIOTIC DAILY PO) Take 1 tablet by mouth daily.     Marland Kitchen topiramate (TOPAMAX) 100 MG tablet Take 1 tablet (100 mg total) by mouth 2 (two) times daily. 60 tablet 5   No current facility-administered medications for this visit.     Family History  Problem Relation Age of Onset  . Depression Mother     ? onset in mid 58s  . Hypertension Mother   . Asthma Father   . Diabetes Father   . Thyroid nodules Father     S/P thyroidectomy  . Heart disease Father     S/P defibrillator  . Cancer Maternal Grandmother     ?? female  cancer  . Cancer Maternal Grandfather     esophageal  . Thyroid nodules Paternal Grandmother   . Colon cancer Neg Hx   . Breast cancer Neg Hx     ROS:  Pertinent items are noted in HPI.  Otherwise, a comprehensive ROS was negative.  Exam:   BP 120/82   Pulse 64   Resp 16   Ht  (1.727 m)   Wt 257 lb (116.6 kg)   LMP 11/07/2016 (Exact Date)   BMI 39.08 kg/m  Height:  (172.7 cm) Ht Readings from Last 3 Encounters:  11/24/16  (1.727 m)  07/19/16  (1.753 m)  06/14/16  (1.753 m)    General appearance: alert, cooperative and appears stated age Head: Normocephalic, without obvious abnormality, atraumatic Neck: no adenopathy, supple, symmetrical, trachea midline and thyroid normal to inspection and palpation Lungs: clear to auscultation bilaterally Breasts: normal appearance, no masses or tenderness, No nipple retraction or dimpling, No nipple discharge or bleeding, No axillary or supraclavicular adenopathy Heart: regular rate and rhythm Abdomen: soft, non-tender; no masses,  no organomegaly Extremities: extremities normal, atraumatic, no cyanosis or edema Skin: Skin color, texture, turgor normal. No rashes or lesions Lymph nodes: Cervical, supraclavicular, and axillary nodes normal. No abnormal inguinal nodes palpated Neurologic: Grossly normal   Pelvic: External genitalia:  no lesions              Urethra:  normal appearing urethra with no masses, tenderness or lesions              Bartholin's and Skene's: normal                 Vagina: normal appearing vagina with normal color and discharge, no lesions              Cervix: no cervical motion tenderness, no lesions and nulliparous appearance              Pap taken: Yes.   Bimanual Exam:  Uterus:  normal size, contour, position, consistency, mobility, non-tender              Adnexa: normal adnexa and no mass, fullness, tenderness Limited by body habitus               Rectovaginal: Confirms                Anus:  normal appearance  Chaperone present: yes  A:  Well Woman with normal exam  Contraception POP due to migraine with aura interested in [place] IUD  Hyperlipidemia,Hypothyroid,Celiac disease with MD management  Obesity on weight maintenance at present  Screening labs  P:   Reviewed health and wellness pertinent to exam  Will renew POP until patient decides on change. Stressed condom use as backup with missed pills. Try to take at same time daily.  Reviewed risks/benefits/insertion/removal of IUD and Nexplanon. Discussed bleeding expectations. Brochures given and insurance info to check with her benefits. Questions addressed.  Lab: GC,Chlamydia, Affirm  Pap smear: yes  counseled on breast self exam, feminine hygiene, use and side effects of OCP's, adequate intake of calcium and vitamin D, diet and exercise  return annually or prn  An After Visit Summary was printed and given to the patient.

## 2016-11-24 NOTE — Patient Instructions (Signed)

## 2016-11-25 ENCOUNTER — Other Ambulatory Visit: Payer: Self-pay

## 2016-11-25 ENCOUNTER — Encounter: Payer: Self-pay | Admitting: Internal Medicine

## 2016-11-25 DIAGNOSIS — B9689 Other specified bacterial agents as the cause of diseases classified elsewhere: Secondary | ICD-10-CM

## 2016-11-25 DIAGNOSIS — N760 Acute vaginitis: Principal | ICD-10-CM

## 2016-11-25 LAB — IPS PAP TEST WITH REFLEX TO HPV

## 2016-11-25 LAB — WET PREP BY MOLECULAR PROBE
CANDIDA SPECIES: NOT DETECTED
Gardnerella vaginalis: DETECTED — AB
Trichomonas vaginosis: NOT DETECTED

## 2016-11-25 MED ORDER — METRONIDAZOLE 500 MG PO TABS: 500.0000 mg | ORAL_TABLET | Freq: Two times a day (BID) | ORAL | 0 refills | Status: DC

## 2016-11-25 NOTE — Telephone Encounter (Signed)
[email or web address]

## 2016-11-25 NOTE — Telephone Encounter (Signed)
-----   Message from Verner Chol, CNM sent at 11/25/2016  7:44 AM EDT ----- Notify patient that wet prep was positive for BV. Yeast and trichomonas negative Needs Rx Flagyl 500 mg bid x 7 days Pap pending

## 2016-11-27 NOTE — Progress Notes (Signed)
Encounter reviewed Gwendolyn Rosander, MD   

## 2016-12-01 ENCOUNTER — Ambulatory Visit (INDEPENDENT_AMBULATORY_CARE_PROVIDER_SITE_OTHER): Payer: BLUE CROSS/BLUE SHIELD | Admitting: Internal Medicine

## 2016-12-01 ENCOUNTER — Encounter: Payer: Self-pay | Admitting: Internal Medicine

## 2016-12-01 VITALS — BP 126/80 | HR 76 | Temp 98.0°F | Resp 14 | Ht 68.0 in | Wt 262.5 lb

## 2016-12-01 DIAGNOSIS — R002 Palpitations: Secondary | ICD-10-CM

## 2016-12-01 DIAGNOSIS — F439 Reaction to severe stress, unspecified: Secondary | ICD-10-CM

## 2016-12-01 DIAGNOSIS — G43109 Migraine with aura, not intractable, without status migrainosus: Secondary | ICD-10-CM

## 2016-12-01 MED ORDER — KETOROLAC TROMETHAMINE 10 MG PO TABS: 10.0000 mg | ORAL_TABLET | Freq: Every day | ORAL | 1 refills | Status: AC | PRN

## 2016-12-01 NOTE — Progress Notes (Signed)
Pre visit review using our clinic review tool, if applicable. No additional management support is needed unless otherwise documented below in the visit note. 

## 2016-12-01 NOTE — Assessment & Plan Note (Signed)
Migraines: Topamax was increased, better, less freq HAs ( < than 1 per month);  Indocin helps but caused her to be dizzy. Will continue with Topamax and switch indocin to Toradol 10 mg one or 2 tablets a day prn HAs. Continue avoiding triggers. Palpitations: Improved, she thinks related to the fact that she is having less migraines Stress anxiety: Still high, work related. RTC 6-8 months.

## 2016-12-01 NOTE — Patient Instructions (Signed)
Next visit in 6 to 8 months

## 2016-12-01 NOTE — Progress Notes (Signed)
Subjective:    Patient ID: Gwendolyn [name], female    DOB: 01-28-1986, 31 y.o.   MRN: [id number]  DOS:  12/01/2016 Type of visit - description : rov Interval history: Migraines: Improved, less frequent, less than once a month, interestingly, whenever he takes Indocin she gets slightly dizzy. Palpitations have decreased stress: Still there, does not feel she needs any specific medication.   Review of Systems   Past Medical History:  Diagnosis Date  . Abnormal pap    9/11Hx abn.pap/colpo--bx neg, 3/14 ASCUS +HPV no colpo due to age  . Anemia    PMH of  . Celiac disease 09/26/2014  . Depression   . Hashimoto's thyroiditis    Dr. Sharl Ma  . Hyperlipidemia 07/20/2010   LDL 150, HDL 37.9  . Hypothyroid    hypothyroidism  . IBS (irritable bowel syndrome) 06/2015   Dr. Bosie Clos  . Migraine headache with aura 03/13/2013   See 03/13/13 throbbing left frontal headache with radiation to the occiput. Possible prodrome of flushing. Oral and blurred vision and photosensitivity.  Family history of migraines in her mother   . Nonspecific elevation of levels of transaminase or lactic acid dehydrogenase (LDH) 07/20/2010   PMH of; ALT 47   . Seasonal allergies    rhinitis  . Wrist fracture, right at age 23   no surgery    Past Surgical History:  Procedure Laterality Date  . COLPOSCOPY     2011 neg  . ESOPHAGOGASTRODUODENOSCOPY  06-2014   Dr Bosie Clos, biopsies c/w celiac disease  . KNEE ARTHROSCOPY  2012   L; Dr Shelle Iron  . UPPER GASTROINTESTINAL ENDOSCOPY  06-2015   Dr. Bosie Clos, normal oropharynx, normal esophagus  . WISDOM TOOTH EXTRACTION      Social History   Social History  . Marital status: Single    Spouse name: N/A  . Number of children: 0  . Years of education: N/A   Occupational History  . 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 Smoking status: Never Smoker  . Smokeless tobacco: Never Used  . Alcohol use No  . Drug use: No  . Sexual activity: Yes   Partners: Male    Birth control/ protection: Pill   Other Topics Concern  . Not on file   Social History Narrative   Lives w/ parents        Allergies as of 12/01/2016      Reactions   Gluten Meal Diarrhea, Nausea And Vomiting      Medication List       Accurate as of 12/01/16  9:19 PM. Always use your most recent med list.          azelastine 0.1 % nasal spray Commonly known as:  ASTELIN Place 2 sprays into both nostrils at bedtime as needed for rhinitis. Use in each nostril as directed   cetirizine 10 MG tablet Commonly known as:  ZYRTEC Take 10 mg by mouth daily.   fluticasone 50 MCG/ACT nasal spray Commonly known as:  FLONASE Place 1 spray into both nostrils daily. Reported on 10/09/2015   hyoscyamine 0.125 MG SL tablet Commonly known as:  LEVSIN SL Place 0.125 mg under the tongue every 4 (four) hours as needed.   ketorolac 10 MG tablet Commonly known as:  TORADOL Take 1-2 tablets (10-20 mg total) by mouth daily as needed (headaches).   levocetirizine 5 MG tablet Commonly known as:  XYZAL Take 5 mg by mouth every evening.  levothyroxine 200 MCG tablet Commonly known as:  SYNTHROID, LEVOTHROID Take 200 mcg by mouth daily before breakfast.   metroNIDAZOLE 500 MG tablet Commonly known as:  FLAGYL Take 1 tablet (500 mg total) by mouth 2 (two) times daily.   multivitamin with minerals tablet Take 1 tablet by mouth daily.   norethindrone 0.35 MG tablet Commonly known as:  MICRONOR,CAMILA,ERRIN Take 1 tablet (0.35 mg total) by mouth daily.   PROBIOTIC DAILY PO Take 1 tablet by mouth daily.   topiramate 100 MG tablet Commonly known as:  TOPAMAX Take 1 tablet (100 mg total) by mouth 2 (two) times daily.          Objective:   Physical Exam BP 126/80 (BP Location: Left Arm, Patient Position: Sitting, Cuff Size: Normal)   Pulse 76   Temp 98 F (36.7 C) (Oral)   Resp 14   Ht  (1.727 m)   Wt 262 lb 8 oz (119.1 kg)   LMP 11/07/2016 (Exact  Date)   SpO2 98%   BMI 39.91 kg/m  General:   Well developed, well nourished . NAD.  HEENT:  Normocephalic . Face symmetric, atraumatic Lungs:  CTA B Normal respiratory effort, no intercostal retractions, no accessory muscle use. Heart: RRR,  no murmur.  No pretibial edema bilaterally  Skin: Not pale. Not jaundice Neurologic:  alert & oriented X3.  Speech normal, gait appropriate for age and unassisted Psych--  Cognition and judgment appear intact.  Cooperative with normal attention span and concentration.  Behavior appropriate. No anxious or depressed appearing.      Assessment & Plan:   Assessment Hypothyroidism, h/o Hashimoto's, Dr Sharl Ma Hyperlipidemia Depression Migraines Vitamin D deficiency GI Dr Bosie Clos  ---Celiac disease ---IBS   Abnormal Pap smears, colpo, BX negative, 10-2012 ASCUS and +-HPV  PLAN: Migraines: Topamax was increased, better, less freq HAs ( < than 1 per month);  Indocin helps but caused her to be dizzy. Will continue with Topamax and switch indocin to Toradol 10 mg one or 2 tablets a day prn HAs. Continue avoiding triggers. Palpitations: Improved, she thinks related to the fact that she is having less migraines Stress anxiety: Still high, work related. RTC 6-8 months.

## 2016-12-07 IMAGING — NM NM HEPATO W/GB/PHARM/[PERSON_NAME]
3 series · 13 of 13 positions shown · non-contrast
Comparison: None.

CLINICAL DATA: Right upper quadrant pain for 6 months.

EXAM:
NUCLEAR MEDICINE HEPATOBILIARY IMAGING WITH GALLBLADDER EF
TECHNIQUE: Sequential images of the abdomen were obtained [DATE] minutes
following intravenous administration of radiopharmaceutical. After
slow intravenous infusion of 2.0 micrograms Cholecystokinin,
gallbladder ejection fraction was determined.
RADIOPHARMACEUTICALS:  5.0 mCi Ac-55m Choletec IV

[he hepatobiliary · 3.43mm/px · 6 of 60 frames shown (1 of 3)]
[frame 6/60]
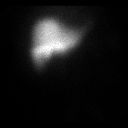
[frame 16/60]
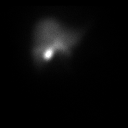
[frame 26/60]
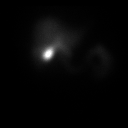
[frame 36/60]
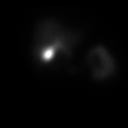
[frame 46/60]
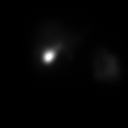
[frame 56/60]
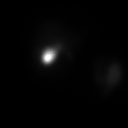

[he hepatobiliary · 1 of 1 slices shown (2 of 3)]
[im 1/1]
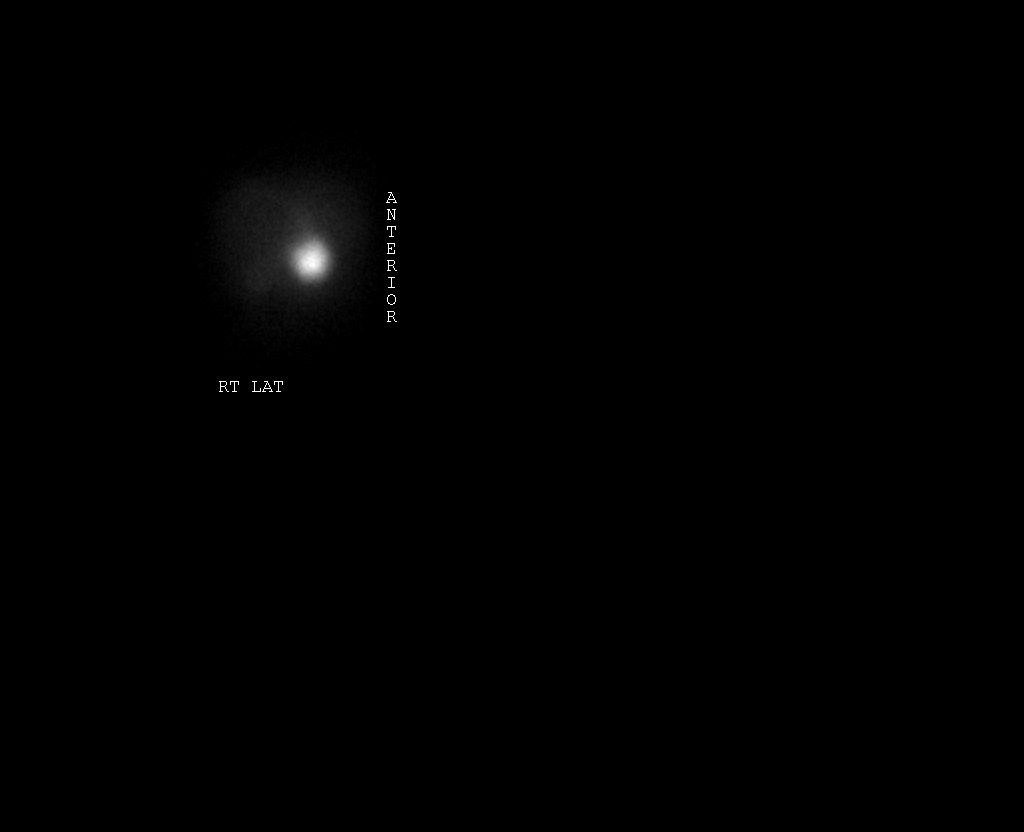

[he hepatobiliary · 3.43mm/px · 6 of 60 frames shown (3 of 3)]
[frame 6/60]
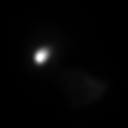
[frame 16/60]
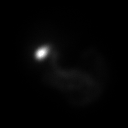
[frame 26/60]
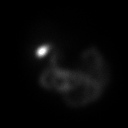
[frame 36/60]
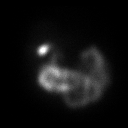
[frame 46/60]
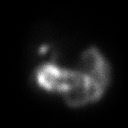
[frame 56/60]
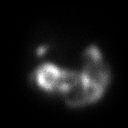

[13 of 13 positions shown; findings below may reference images not displayed]

FINDINGS: Gallbladder right activity occurs after 9 min. Small bowel activity
occurs after 20 min. Gallbladder ejection fraction is 92%. The
patient experienced slight pain with injection of CCK. At 45 min,
normal ejection fraction is greater than 40%.
IMPRESSION: Cystic and common bile ducts are patent. Gallbladder ejection
fraction is normal.

## 2016-12-12 ENCOUNTER — Encounter: Payer: Self-pay | Admitting: Internal Medicine

## 2016-12-23 IMAGING — US US ABDOMEN COMPLETE
1 series · 13 of 25 positions shown · non-contrast
Comparison: None.

CLINICAL DATA: Abdominal pain with nausea and vomiting for 2 weeks

EXAM:
ULTRASOUND ABDOMEN COMPLETE

[Series 1: us abdomen complete · 0.24mm/px · 13 of 82 slices shown]
[im 1/82]
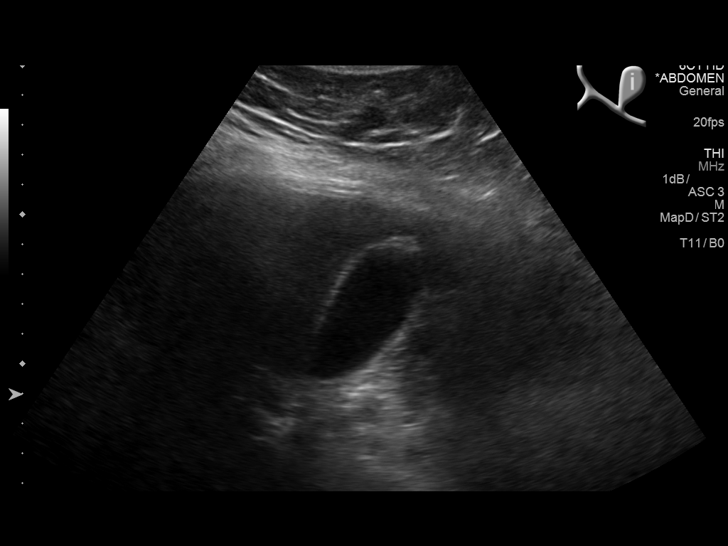
[im 7/82]
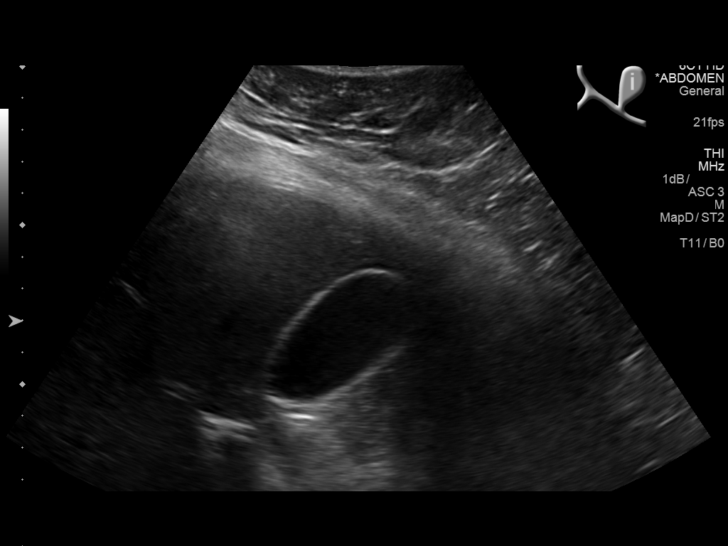
[im 14/82]
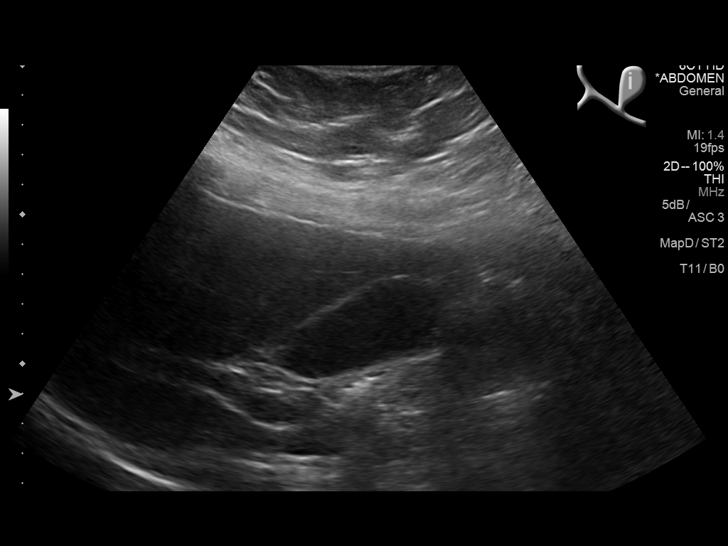
[im 21/82]
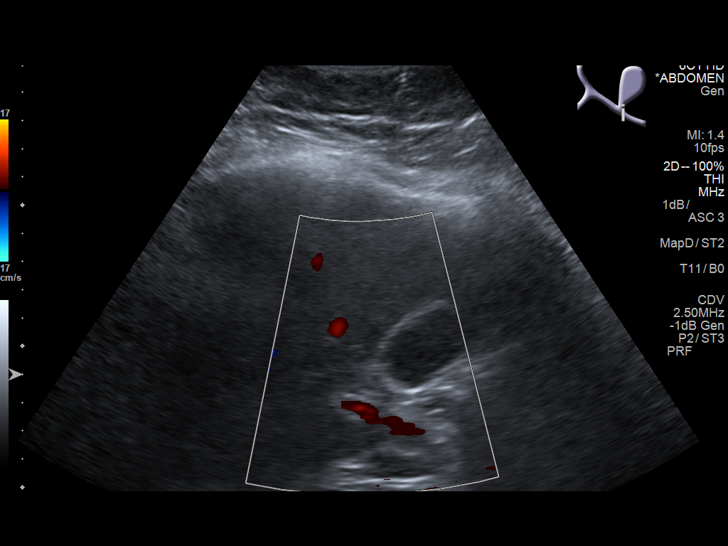
[im 28/82]
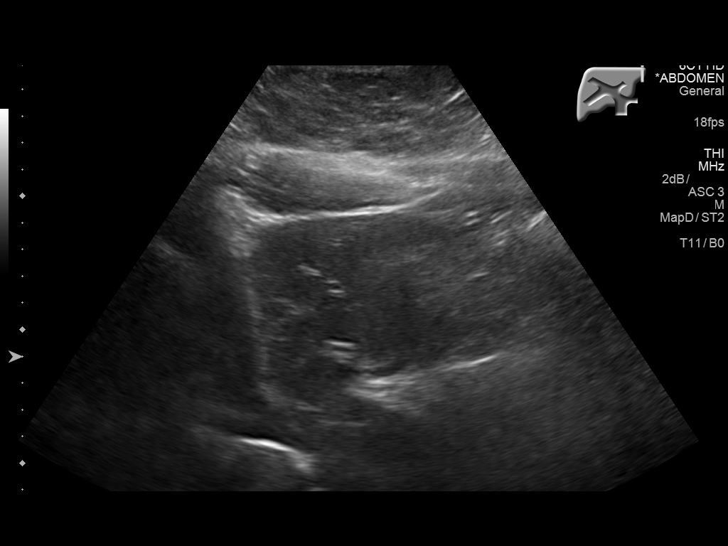
[im 34/82]
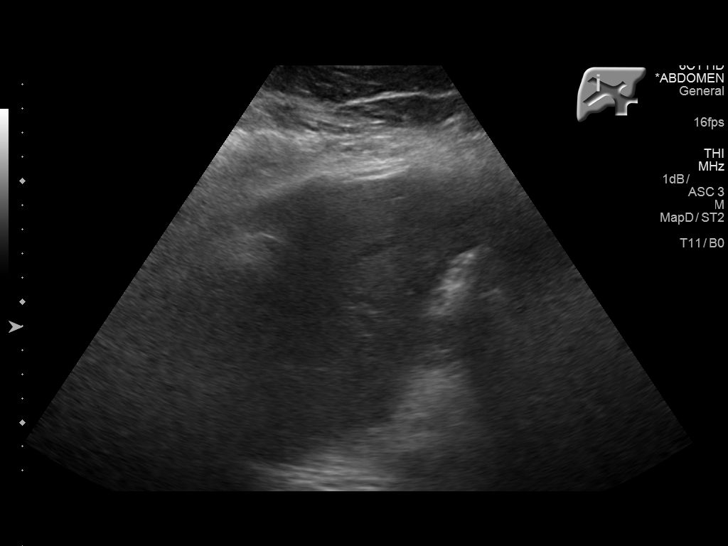
[im 41/82]
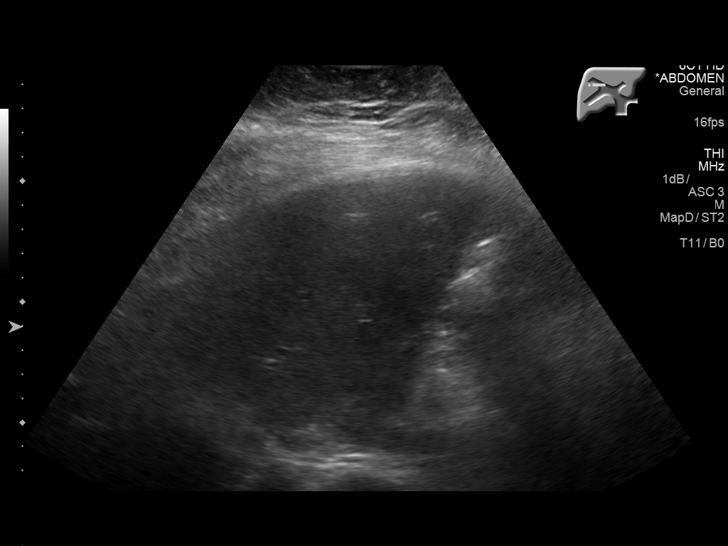
[im 48/82]
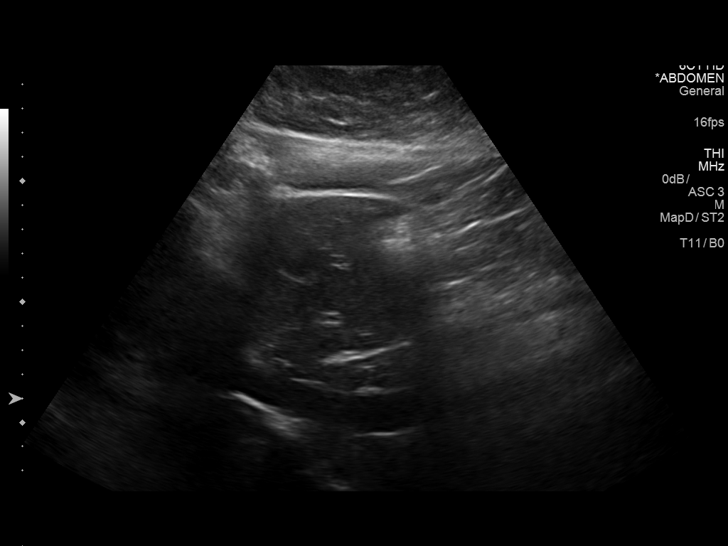
[im 55/82]
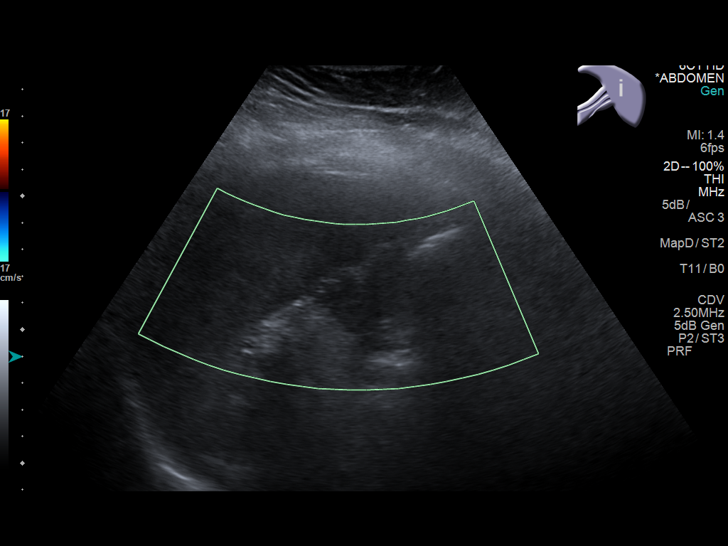
[im 61/82]
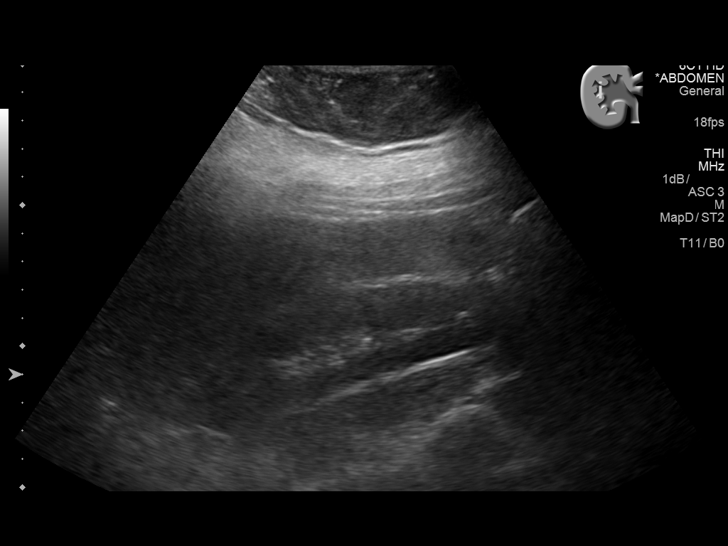
[im 68/82]
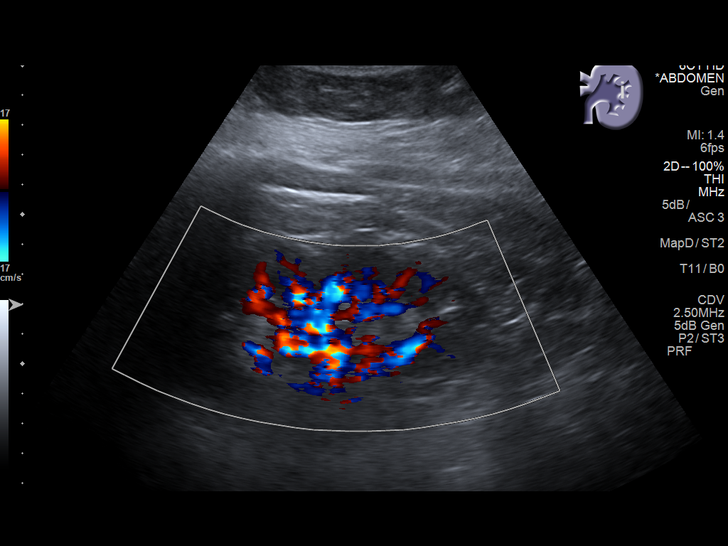
[im 75/82]
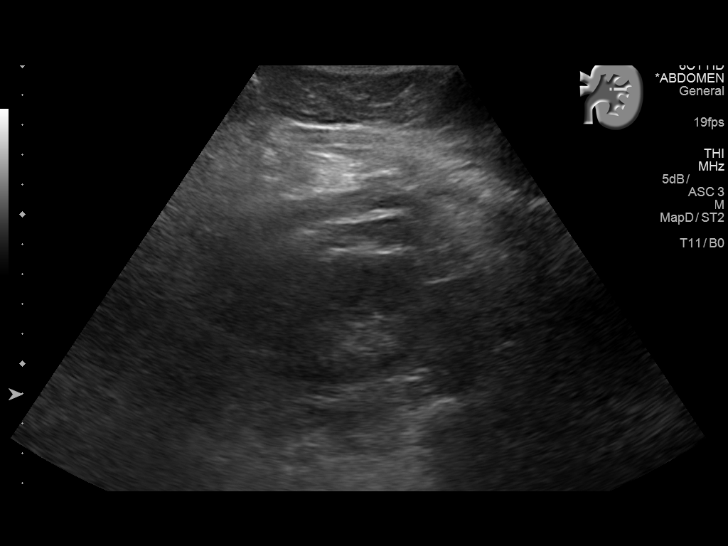
[im 82/82]
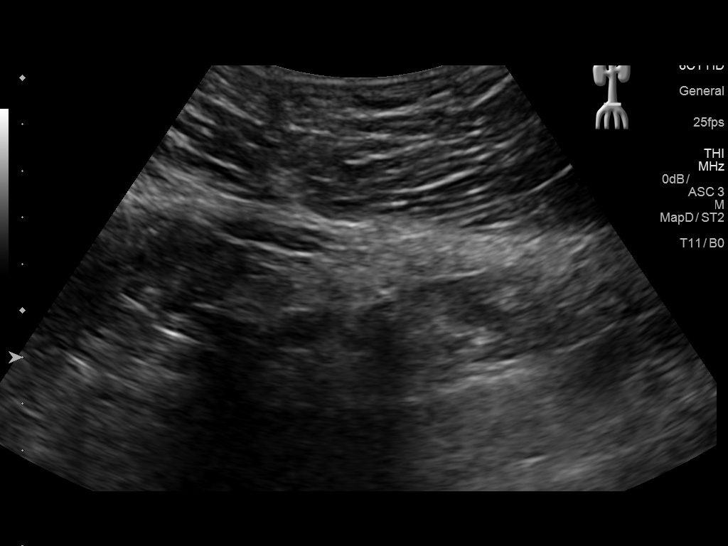

[13 of 25 positions shown; findings below may reference images not displayed]

FINDINGS: Gallbladder: No gallstones or wall thickening visualized. There is
no pericholecystic fluid. No sonographic Murphy sign noted.

Common bile duct: Diameter: 4 mm. There is no intrahepatic, common
hepatic, or common bile duct dilatation.

Liver: No focal lesion identified. Liver echogenicity is diffusely
increased.

IVC: No abnormality visualized.

Pancreas: Visualized portion unremarkable. Portions of pancreas
obscured by gas.

Spleen: Size and appearance within normal limits.

Right Kidney: Length: 11.8 cm. Echogenicity within normal limits. No
mass or hydronephrosis visualized.

Left Kidney: Length: 11.6 cm. Echogenicity within normal limits. No
mass or hydronephrosis visualized.

Abdominal aorta: No aneurysm visualized.

Other findings: No demonstrable ascites.
IMPRESSION: Increased liver echogenicity, a finding most likely due to hepatic
steatosis. While no focal liver lesions are identified, it must be
cautioned that the sensitivity of ultrasound for focal liver lesions
is diminished in this circumstance. Portions of the pancreas are
obscured by gas. The visualized portions of the pancreas appear
normal. Study otherwise unremarkable.

## 2017-03-05 ENCOUNTER — Encounter: Payer: Self-pay | Admitting: Internal Medicine

## 2017-05-09 ENCOUNTER — Encounter: Payer: Self-pay | Admitting: Internal Medicine

## 2017-05-10 ENCOUNTER — Other Ambulatory Visit: Payer: Self-pay

## 2017-05-11 ENCOUNTER — Encounter: Payer: Self-pay | Admitting: Internal Medicine

## 2017-05-11 ENCOUNTER — Ambulatory Visit (INDEPENDENT_AMBULATORY_CARE_PROVIDER_SITE_OTHER): Payer: BLUE CROSS/BLUE SHIELD | Admitting: Internal Medicine

## 2017-05-11 VITALS — BP 116/64 | HR 74 | Temp 97.6°F | Resp 14 | Ht 68.0 in | Wt 256.2 lb

## 2017-05-11 DIAGNOSIS — Z23 Encounter for immunization: Secondary | ICD-10-CM | POA: Diagnosis not present

## 2017-05-11 DIAGNOSIS — Z0000 Encounter for general adult medical examination without abnormal findings: Secondary | ICD-10-CM

## 2017-05-11 NOTE — Assessment & Plan Note (Signed)
-  Tdap today  Had a flu shot  -Female care per gyn , last visit 11-2016 -Diet and exercise : Discussed, emphasized portion control -Labs:  CMP, FLP, CBC

## 2017-05-11 NOTE — Patient Instructions (Signed)
GO TO THE LAB : Get the blood work     GO TO THE FRONT DESK Schedule your next appointment for a  physical exam in one year  

## 2017-05-11 NOTE — Progress Notes (Signed)
Subjective:    Patient ID: Gwendolyn [name], female    DOB: 1986-02-09, 31 y.o.   MRN: [id number]  DOS:  05/11/2017 Type of visit - description : cpx Interval history:  No major concerns  Review of Systems  occasional difficulty hearing, ear wax?  Other than above, a 14 point review of systems is negative     Past Medical History:  Diagnosis Date  . Abnormal pap    9/11Hx abn.pap/colpo--bx neg, 3/14 ASCUS +HPV no colpo due to age  . Anemia    PMH of  . Celiac disease 09/26/2014  . Depression   . Hashimoto's thyroiditis    Dr. Sharl Ma  . Hypothyroid    hypothyroidism  . IBS (irritable bowel syndrome) 06/2015   Dr. Bosie Clos  . Migraine headache with aura 03/13/2013   See 03/13/13 throbbing left frontal headache with radiation to the occiput. Possible prodrome of flushing. Oral and blurred vision and photosensitivity.  Family history of migraines in her mother   . Nonspecific elevation of levels of transaminase or lactic acid dehydrogenase (LDH) 07/20/2010   PMH of; ALT 47   . Seasonal allergies    rhinitis  . Wrist fracture, right at age 16   no surgery    Past Surgical History:  Procedure Laterality Date  . COLPOSCOPY     2011 neg  . ESOPHAGOGASTRODUODENOSCOPY  06-2014   Dr Bosie Clos, biopsies c/w celiac disease  . KNEE ARTHROSCOPY  2012   L; Dr Shelle Iron  . UPPER GASTROINTESTINAL ENDOSCOPY  06-2015   Dr. Bosie Clos, normal oropharynx, normal esophagus  . WISDOM TOOTH EXTRACTION      Social History   Social History  . Marital status: Single    Spouse name: N/A  . Number of children: 0  . Years of education: N/A   Occupational History  . 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 Smoking status: Never Smoker  . Smokeless tobacco: Never Used  . Alcohol use No  . Drug use: No  . Sexual activity: Yes    Partners: Male    Birth control/ protection: Pill   Other Topics Concern  . Not on file   Social History Narrative   Lives w/ parents       Family  History  Problem Relation Age of Onset  . Depression Mother        ? onset in mid 66s  . Hypertension Mother   . Asthma Father   . Diabetes Father   . Thyroid nodules Father        S/P thyroidectomy  . Heart disease Father        S/P defibrillator  . Cancer Maternal Grandmother        ?? female cancer  . Cancer Maternal Grandfather        esophageal  . Thyroid nodules Paternal Grandmother   . Colon cancer Neg Hx   . Breast cancer Neg Hx      Allergies as of 05/11/2017      Reactions   Gluten Meal Diarrhea, Nausea And Vomiting      Medication List       Accurate as of 05/11/17 11:59 PM. Always use your most recent med list.          cetirizine 10 MG tablet Commonly known as:  ZYRTEC Take 10 mg by mouth daily.   hyoscyamine 0.125 MG SL tablet Commonly known as:  LEVSIN SL Place 0.125 mg under the  tongue every 4 (four) hours as needed.   ketorolac 10 MG tablet Commonly known as:  TORADOL Take 1-2 tablets (10-20 mg total) by mouth daily as needed (headaches).   levocetirizine 5 MG tablet Commonly known as:  XYZAL Take 5 mg by mouth every evening.   levothyroxine 175 MCG tablet Commonly known as:  SYNTHROID, LEVOTHROID Take 175 mcg by mouth daily before breakfast.   multivitamin with minerals tablet Take 1 tablet by mouth daily.   norethindrone 0.35 MG tablet Commonly known as:  MICRONOR,CAMILA,ERRIN Take 1 tablet (0.35 mg total) by mouth daily.   PROBIOTIC DAILY PO Take 1 tablet by mouth daily.          Objective:   Physical Exam BP 116/64 (BP Location: Left Arm, Patient Position: Sitting, Cuff Size: Normal)   Pulse 74   Temp 97.6 F (36.4 C) (Oral)   Resp 14   Ht  (1.727 m)   Wt 256 lb 4 oz (116.2 kg)   LMP 05/05/2017 (Exact Date)   SpO2 98%   BMI 38.96 kg/m   General:   Well developed,  NAD.  HEENT:  Normocephalic . Face symmetric, atraumatic. Abundant wax noted that the right ear, removed with a spoon, otherwise TMs and ears are  completely normal Lungs:  CTA B Normal respiratory effort, no intercostal retractions, no accessory muscle use. Heart: RRR,  no murmur.  No pretibial edema bilaterally  Abdomen:  Not distended, soft, non-tender. No rebound or rigidity.   Skin: Exposed areas without rash. Not pale. Not jaundice Neurologic:  alert & oriented X3.  Speech normal, gait appropriate for age and unassisted Strength symmetric and appropriate for age.  Psych: Cognition and judgment appear intact.  Cooperative with normal attention span and concentration.  Behavior appropriate. No anxious or depressed appearing.    Assessment & Plan:   Assessment Hypothyroidism, h/o Hashimoto's, Dr Sharl Ma H/o Depression Migraines Vitamin D deficiency GI Dr Bosie Clos  ---Celiac disease ---IBS   Abnormal Pap smears, colpo, BX negative, 10-2012 ASCUS and +-HPV  PLAN: Hypothyroidism: Follow-up by endocrinology Migraines: Since the last time she was here, she d/c Topamax due to s/e ( extremities numbness).. Currently doing well, headaches are much less frequent, still has some Toradol, she has use it one or twice since the last visit. RTC one year

## 2017-05-11 NOTE — Progress Notes (Signed)
Pre visit review using our clinic review tool, if applicable. No additional management support is needed unless otherwise documented below in the visit note. 

## 2017-05-12 LAB — LIPID PANEL
Cholesterol: 168 mg/dL (ref 0–200)
HDL: 45.5 mg/dL (ref >39.00)
LDL CALC: 105 mg/dL — AB (ref 0–99)
NONHDL: 122.91
Total CHOL/HDL Ratio: 4
Triglycerides: 90 mg/dL (ref 0.0–149.0)
VLDL: 18 mg/dL (ref 0.0–40.0)

## 2017-05-12 LAB — COMPREHENSIVE METABOLIC PANEL
ALK PHOS: 73 U/L (ref 39–117)
ALT: 27 U/L (ref 0–35)
AST: 23 U/L (ref 0–37)
Albumin: 4.6 g/dL (ref 3.5–5.2)
BUN: 11 mg/dL (ref 6–23)
CHLORIDE: 102 meq/L (ref 96–112)
CO2: 29 mEq/L (ref 19–32)
Calcium: 9.9 mg/dL (ref 8.4–10.5)
Creatinine, Ser: 0.84 mg/dL (ref 0.40–1.20)
GFR: 85.03 mL/min (ref >60.00)
GLUCOSE: 81 mg/dL (ref 70–99)
POTASSIUM: 4.5 meq/L (ref 3.5–5.1)
SODIUM: 140 meq/L (ref 135–145)
TOTAL PROTEIN: 8.1 g/dL (ref 6.0–8.3)
Total Bilirubin: 0.7 mg/dL (ref 0.2–1.2)

## 2017-05-12 LAB — CBC WITH DIFFERENTIAL/PLATELET
Basophils Absolute: 0.1 10*3/uL (ref 0.0–0.1)
Basophils Relative: 0.7 % (ref 0.0–3.0)
EOS ABS: 0.2 10*3/uL (ref 0.0–0.7)
EOS PCT: 2.1 % (ref 0.0–5.0)
HCT: 42.9 % (ref 36.0–46.0)
Hemoglobin: 14.3 g/dL (ref 12.0–15.0)
LYMPHS ABS: 2 10*3/uL (ref 0.7–4.0)
Lymphocytes Relative: 24.2 % (ref 12.0–46.0)
MCHC: 33.2 g/dL (ref 30.0–36.0)
MCV: 86 fl (ref 78.0–100.0)
MONO ABS: 0.5 10*3/uL (ref 0.1–1.0)
Monocytes Relative: 5.7 % (ref 3.0–12.0)
Neutro Abs: 5.6 10*3/uL (ref 1.4–7.7)
Neutrophils Relative %: 67.3 % (ref 43.0–77.0)
Platelets: 442 10*3/uL — ABNORMAL HIGH (ref 150.0–400.0)
RBC: 4.99 Mil/uL (ref 3.87–5.11)
RDW: 12.8 % (ref 11.5–15.5)
WBC: 8.3 10*3/uL (ref 4.0–10.5)

## 2017-05-12 NOTE — Assessment & Plan Note (Signed)
Hypothyroidism: Follow-up by endocrinology Migraines: Since the last time she was here, she d/c Topamax due to s/e ( extremities numbness).. Currently doing well, headaches are much less frequent, still has some Toradol, she has use it one or twice since the last visit. RTC one year

## 2017-07-06 ENCOUNTER — Encounter: Payer: Self-pay | Admitting: Internal Medicine

## 2017-07-06 ENCOUNTER — Ambulatory Visit: Payer: BLUE CROSS/BLUE SHIELD | Admitting: Internal Medicine

## 2017-07-06 VITALS — BP 134/78 | HR 78 | Temp 97.5°F | Resp 14 | Ht 68.0 in | Wt 258.2 lb

## 2017-07-06 DIAGNOSIS — M25511 Pain in right shoulder: Secondary | ICD-10-CM

## 2017-07-06 NOTE — Patient Instructions (Signed)
Call if no better in 2 weeks  IBUPROFEN (Advil or Motrin) 200 mg 2-3  tablets every 12  hours as needed for pain.  Always take it with food because may cause gastritis and ulcers.  f you notice nausea, stomach pain, change in the color of stools --->  Stop the medicine and let us know     Shoulder Exercises Ask your health care provider which exercises are safe for you. Do exercises exactly as told by your health care provider and adjust them as directed. It is normal to feel mild stretching, pulling, tightness, or discomfort as you do these exercises, but you should stop right away if you feel sudden pain or your pain gets worse.Do not begin these exercises until told by your health care provider. RANGE OF MOTION EXERCISES These exercises warm up your muscles and joints and improve the movement and flexibility of your shoulder. These exercises also help to relieve pain, numbness, and tingling. These exercises involve stretching your injured shoulder directly. Exercise A: Pendulum  1. Stand near a wall or a surface that you can hold onto for balance. 2. Bend at the waist and let your left / right arm hang straight down. Use your other arm to support you. Keep your back straight and do not lock your knees. 3. Relax your left / right arm and shoulder muscles, and move your hips and your trunk so your left / right arm swings freely. Your arm should swing because of the motion of your body, not because you are using your arm or shoulder muscles. 4. Keep moving your body so your arm swings in the following directions, as told by your health care provider: ? Side to side. ? Forward and backward. ? In clockwise and counterclockwise circles. 5. Continue each motion for __________ seconds, or for as long as told by your health care provider. 6. Slowly return to the starting position. Repeat __________ times. Complete this exercise __________ times a day. Exercise B:Flexion, Standing  1. Stand and  hold a broomstick, a cane, or a similar object. Place your hands a little more than shoulder-width apart on the object. Your left / right hand should be palm-up, and your other hand should be palm-down. 2. Keep your elbow straight and keep your shoulder muscles relaxed. Push the stick down with your healthy arm to raise your left / right arm in front of your body, and then over your head until you feel a stretch in your shoulder. ? Avoid shrugging your shoulder while you raise your arm. Keep your shoulder blade tucked down toward the middle of your back. 3. Hold for __________ seconds. 4. Slowly return to the starting position. Repeat __________ times. Complete this exercise __________ times a day. Exercise C: Abduction, Standing 1. Stand and hold a broomstick, a cane, or a similar object. Place your hands a little more than shoulder-width apart on the object. Your left / right hand should be palm-up, and your other hand should be palm-down. 2. While keeping your elbow straight and your shoulder muscles relaxed, push the stick across your body toward your left / right side. Raise your left / right arm to the side of your body and then over your head until you feel a stretch in your shoulder. ? Do not raise your arm above shoulder height, unless your health care provider tells you to do that. ? Avoid shrugging your shoulder while you raise your arm. Keep your shoulder blade tucked down toward the middle  of your back. 3. Hold for __________ seconds. 4. Slowly return to the starting position. Repeat __________ times. Complete this exercise __________ times a day. Exercise D:Internal Rotation  1. Place your left / right hand behind your back, palm-up. 2. Use your other hand to dangle an exercise band, a towel, or a similar object over your shoulder. Grasp the band with your left / right hand so you are holding onto both ends. 3. Gently pull up on the band until you feel a stretch in the front of your  left / right shoulder. ? Avoid shrugging your shoulder while you raise your arm. Keep your shoulder blade tucked down toward the middle of your back. 4. Hold for __________ seconds. 5. Release the stretch by letting go of the band and lowering your hands. Repeat __________ times. Complete this exercise __________ times a day. STRETCHING EXERCISES These exercises warm up your muscles and joints and improve the movement and flexibility of your shoulder. These exercises also help to relieve pain, numbness, and tingling. These exercises are done using your healthy shoulder to help stretch the muscles of your injured shoulder. Exercise E: Research officer, political partyCorner Stretch (External Rotation and Abduction)  1. Stand in a doorway with one of your feet slightly in front of the other. This is called a staggered stance. If you cannot reach your forearms to the door frame, stand facing a corner of a room. 2. Choose one of the following positions as told by your health care provider: ? Place your hands and forearms on the door frame above your head. ? Place your hands and forearms on the door frame at the height of your head. ? Place your hands on the door frame at the height of your elbows. 3. Slowly move your weight onto your front foot until you feel a stretch across your chest and in the front of your shoulders. Keep your head and chest upright and keep your abdominal muscles tight. 4. Hold for __________ seconds. 5. To release the stretch, shift your weight to your back foot. Repeat __________ times. Complete this stretch __________ times a day. Exercise F:Extension, Standing 1. Stand and hold a broomstick, a cane, or a similar object behind your back. ? Your hands should be a little wider than shoulder-width apart. ? Your palms should face away from your back. 2. Keeping your elbows straight and keeping your shoulder muscles relaxed, move the stick away from your body until you feel a stretch in your  shoulder. ? Avoid shrugging your shoulders while you move the stick. Keep your shoulder blade tucked down toward the middle of your back. 3. Hold for __________ seconds. 4. Slowly return to the starting position. Repeat __________ times. Complete this exercise __________ times a day. STRENGTHENING EXERCISES These exercises build strength and endurance in your shoulder. Endurance is the ability to use your muscles for a long time, even after they get tired. Exercise G:External Rotation  1. Sit in a stable chair without armrests. 2. Secure an exercise band at elbow height on your left / right side. 3. Place a soft object, such as a folded towel or a small pillow, between your left / right upper arm and your body to move your elbow a few inches away (about 10 cm) from your side. 4. Hold the end of the band so it is tight and there is no slack. 5. Keeping your elbow pressed against the soft object, move your left / right forearm out, away from your abdomen. Keep  your body steady so only your forearm moves. 6. Hold for __________ seconds. 7. Slowly return to the starting position. Repeat __________ times. Complete this exercise __________ times a day. Exercise H:Shoulder Abduction  1. Sit in a stable chair without armrests, or stand. 2. Hold a __________ weight in your left / right hand, or hold an exercise band with both hands. 3. Start with your arms straight down and your left / right palm facing in, toward your body. 4. Slowly lift your left / right hand out to your side. Do not lift your hand above shoulder height unless your health care provider tells you that this is safe. ? Keep your arms straight. ? Avoid shrugging your shoulder while you do this movement. Keep your shoulder blade tucked down toward the middle of your back. 5. Hold for __________ seconds. 6. Slowly lower your arm, and return to the starting position. Repeat __________ times. Complete this exercise __________ times a  day. Exercise I:Shoulder Extension 1. Sit in a stable chair without armrests, or stand. 2. Secure an exercise band to a stable object in front of you where it is at shoulder height. 3. Hold one end of the exercise band in each hand. Your palms should face each other. 4. Straighten your elbows and lift your hands up to shoulder height. 5. Step back, away from the secured end of the exercise band, until the band is tight and there is no slack. 6. Squeeze your shoulder blades together as you pull your hands down to the sides of your thighs. Stop when your hands are straight down by your sides. Do not let your hands go behind your body. 7. Hold for __________ seconds. 8. Slowly return to the starting position. Repeat __________ times. Complete this exercise __________ times a day. Exercise J:Standing Shoulder Row 1. Sit in a stable chair without armrests, or stand. 2. Secure an exercise band to a stable object in front of you so it is at waist height. 3. Hold one end of the exercise band in each hand. Your palms should be in a thumbs-up position. 4. Bend each of your elbows to an "L" shape (about 90 degrees) and keep your upper arms at your sides. 5. Step back until the band is tight and there is no slack. 6. Slowly pull your elbows back behind you. 7. Hold for __________ seconds. 8. Slowly return to the starting position. Repeat __________ times. Complete this exercise __________ times a day. Exercise K:Shoulder Press-Ups  1. Sit in a stable chair that has armrests. Sit upright, with your feet flat on the floor. 2. Put your hands on the armrests so your elbows are bent and your fingers are pointing forward. Your hands should be about even with the sides of your body. 3. Push down on the armrests and use your arms to lift yourself off of the chair. Straighten your elbows and lift yourself up as much as you comfortably can. ? Move your shoulder blades down, and avoid letting your shoulders  move up toward your ears. ? Keep your feet on the ground. As you get stronger, your feet should support less of your body weight as you lift yourself up. 4. Hold for __________ seconds. 5. Slowly lower yourself back into the chair. Repeat __________ times. Complete this exercise __________ times a day. Exercise L: Wall Push-Ups  1. Stand so you are facing a stable wall. Your feet should be about one arm-length away from the wall. 2. Lean forward and  place your palms on the wall at shoulder height. 3. Keep your feet flat on the floor as you bend your elbows and lean forward toward the wall. 4. Hold for __________ seconds. 5. Straighten your elbows to push yourself back to the starting position. Repeat __________ times. Complete this exercise __________ times a day. This information is not intended to replace advice given to you by your health care provider. Make sure you discuss any questions you have with your health care provider. Document Released: 06/09/2005 Document Revised: 04/19/2016 Document Reviewed: 04/06/2015 Elsevier Interactive Patient Education  2018 [employer].

## 2017-07-06 NOTE — Assessment & Plan Note (Signed)
R shoulder pain:  unclear etiology, sprain?. Recommend anti-inflammatories, home exercises, call if not better, might need sports medicine referral..  See AVS

## 2017-07-06 NOTE — Progress Notes (Signed)
Subjective:    Patient ID: Gwendolyn [name], female    DOB: 1985-09-17, 31 y.o.   MRN: [id number]  DOS:  07/06/2017 Type of visit - description : acute Interval history: Developed R shoulder pain 7-10 days ago. Pain is diffuse within the joint, worse with certain movements, also pain at night mostly depending on position. Denies any injuries and has not taken any medications for it except for one tramadol at night which helped   Review of Systems Denies neck pain, lower extremity paresthesias  Past Medical History:  Diagnosis Date  . Abnormal pap    9/11Hx abn.pap/colpo--bx neg, 3/14 ASCUS +HPV no colpo due to age  . Anemia    PMH of  . Celiac disease 09/26/2014  . Depression   . Hashimoto's thyroiditis    Dr. Sharl MaKerr  . Hypothyroid    hypothyroidism  . IBS (irritable bowel syndrome) 06/2015   Dr. Bosie ClosSchooler  . Migraine headache with aura 03/13/2013   See 03/13/13 throbbing left frontal headache with radiation to the occiput. Possible prodrome of flushing. Oral and blurred vision and photosensitivity.  Family history of migraines in her mother   . Nonspecific elevation of levels of transaminase or lactic acid dehydrogenase (LDH) 07/20/2010   PMH of; ALT 47   . Seasonal allergies    rhinitis  . Wrist fracture, right at age 89   no surgery    Past Surgical History:  Procedure Laterality Date  . COLPOSCOPY     2011 neg  . ESOPHAGOGASTRODUODENOSCOPY  06-2014   Dr Bosie ClosSchooler, biopsies c/w celiac disease  . KNEE ARTHROSCOPY  2012   L; Dr Shelle IronBeane  . UPPER GASTROINTESTINAL ENDOSCOPY  06-2015   Dr. Bosie ClosSchooler, normal oropharynx, normal esophagus  . WISDOM TOOTH EXTRACTION      Social History   Socioeconomic History  . Marital status: Single    Spouse name: Not on file  . Number of children: 0  . Years of education: Not on file  . Highest education level: Not on file  Social Needs  . Financial resource strain: Not on file  . Food insecurity - worry: Not on file  . Food insecurity -  inability: Not on file  . Transportation needs - medical: Not on file  . Transportation needs - non-medical: Not on file  Occupational History  . Occupation: CMA-- works at [employer]   Tobacco Use  . Smoking status: Never Smoker  . Smokeless tobacco: Never Used  Substance and Sexual Activity  . Alcohol use: No    Alcohol/week: 0.0 oz  . Drug use: No  . Sexual activity: Yes    Partners: Male    Birth control/protection: Pill  Other Topics Concern  . Not on file  Social History Narrative   Lives w/ parents        Allergies as of 07/06/2017      Reactions   Gluten Meal Diarrhea, Nausea And Vomiting      Medication List        Accurate as of 07/06/17  5:36 PM. Always use your most recent med list.          cetirizine 10 MG tablet Commonly known as:  ZYRTEC Take 10 mg by mouth daily.   hyoscyamine 0.125 MG SL tablet Commonly known as:  LEVSIN SL Place 0.125 mg under the tongue every 4 (four) hours as needed.   ketorolac 10 MG tablet Commonly known as:  TORADOL Take 1-2 tablets (10-20 mg total) by mouth daily as  needed (headaches).   levocetirizine 5 MG tablet Commonly known as:  XYZAL Take 5 mg by mouth every evening.   levothyroxine 175 MCG tablet Commonly known as:  SYNTHROID, LEVOTHROID Take 175 mcg by mouth daily before breakfast.   multivitamin with minerals tablet Take 1 tablet by mouth daily.   norethindrone 0.35 MG tablet Commonly known as:  MICRONOR,CAMILA,ERRIN Take 1 tablet (0.35 mg total) by mouth daily.   PROBIOTIC DAILY PO Take 1 tablet by mouth daily.          Objective:   Physical Exam BP 134/78 (BP Location: Left Arm, Patient Position: Sitting, Cuff Size: Normal)   Pulse 78   Temp (!) 97.5 F (36.4 C) (Oral)   Resp 14   Ht 5\' 8"  (1.727 m)   Wt 258 lb 4 oz (117.1 kg)   LMP 06/28/2017   SpO2 98%   BMI 39.27 kg/m  General:   Well developed, well nourished . NAD.  HEENT:  Normocephalic . Face symmetric, atraumatic Neck: No TTP  of the cervical spine, range of motion normal. MSK: Left shoulder normal Right shoulder: No deformities, no TTP.  Range of motion is normal but did complain of pain with extremes of her ROM. Skin: Not pale. Not jaundice Neurologic:  alert & oriented X3.  Speech normal, gait appropriate for age and unassisted.  Motor symmetric Psych--  Cognition and judgment appear intact.  Cooperative with normal attention span and concentration.  Behavior appropriate. No anxious or depressed appearing.      Assessment & Plan:   Assessment Hypothyroidism, h/o Hashimoto's, Dr Sharl MaKerr H/o Depression Migraines Vitamin D deficiency GI Dr Bosie ClosSchooler  ---Celiac disease ---IBS   Abnormal Pap smears, colpo, BX negative, 10-2012 ASCUS and +-HPV  PLAN: R shoulder pain:  unclear etiology, sprain?. Recommend anti-inflammatories, home exercises, call if not better, might need sports medicine referral..  See AVS

## 2017-07-06 NOTE — Progress Notes (Signed)
Pre visit review using our clinic review tool, if applicable. No additional management support is needed unless otherwise documented below in the visit note. 

## 2017-11-30 ENCOUNTER — Ambulatory Visit: Payer: BLUE CROSS/BLUE SHIELD | Admitting: Certified Nurse Midwife

## 2017-11-30 ENCOUNTER — Encounter: Payer: Self-pay | Admitting: Certified Nurse Midwife

## 2017-11-30 NOTE — Progress Notes (Deleted)
32 y.o. G0P0000 Single  {Race/ethnicity:17218} Fe here for annual exam.    No LMP recorded.          Sexually active: {yes no:314532}  The current method of family planning is {contraception:315051}.    Exercising: {yes no:314532}  {types:19826} Smoker:  {YES NO:22349}  Health Maintenance: Pap:  11-02-12 ASCUS HPV HR+,11-06-13 neg, 11-24-16 neg History of Abnormal Pap: yes MMG:  none Self Breast exams: {YES NO:22349} Colonoscopy:  none BMD:   none TDaP:  2018 Shingles: no Pneumonia: no Hep C and HIV: HIV neg 2016 Labs: ***   reports that she has never smoked. She has never used smokeless tobacco. She reports that she does not drink alcohol or use drugs.  Past Medical History:  Diagnosis Date  . Abnormal pap    9/11Hx abn.pap/colpo--bx neg, 3/14 ASCUS +HPV no colpo due to age  . Anemia    PMH of  . Celiac disease 09/26/2014  . Depression   . Hashimoto's thyroiditis    Dr. Sharl MaKerr  . Hypothyroid    hypothyroidism  . IBS (irritable bowel syndrome) 06/2015   Dr. Bosie ClosSchooler  . Migraine headache with aura 03/13/2013   See 03/13/13 throbbing left frontal headache with radiation to the occiput. Possible prodrome of flushing. Oral and blurred vision and photosensitivity.  Family history of migraines in her mother   . Nonspecific elevation of levels of transaminase or lactic acid dehydrogenase (LDH) 07/20/2010   PMH of; ALT 47   . Seasonal allergies    rhinitis  . Wrist fracture, right at age 89   no surgery    Past Surgical History:  Procedure Laterality Date  . COLPOSCOPY     2011 neg  . ESOPHAGOGASTRODUODENOSCOPY  06-2014   Dr Bosie ClosSchooler, biopsies c/w celiac disease  . KNEE ARTHROSCOPY  2012   L; Dr Shelle IronBeane  . UPPER GASTROINTESTINAL ENDOSCOPY  06-2015   Dr. Bosie ClosSchooler, normal oropharynx, normal esophagus  . WISDOM TOOTH EXTRACTION      Current Outpatient Medications  Medication Sig Dispense Refill  . cetirizine (ZYRTEC) 10 MG tablet Take 10 mg by mouth daily.    . hyoscyamine  (LEVSIN SL) 0.125 MG SL tablet Place 0.125 mg under the tongue every 4 (four) hours as needed.    Marland Kitchen. ketorolac (TORADOL) 10 MG tablet Take 1-2 tablets (10-20 mg total) by mouth daily as needed (headaches). 20 tablet 1  . levocetirizine (XYZAL) 5 MG tablet Take 5 mg by mouth every evening.    Marland Kitchen. levothyroxine (SYNTHROID, LEVOTHROID) 175 MCG tablet Take 175 mcg by mouth daily before breakfast.    . Multiple Vitamins-Minerals (MULTIVITAMIN WITH MINERALS) tablet Take 1 tablet by mouth daily.    . norethindrone (MICRONOR,CAMILA,ERRIN) 0.35 MG tablet Take 1 tablet (0.35 mg total) by mouth daily. 84 tablet 4  . Probiotic Product (PROBIOTIC DAILY PO) Take 1 tablet by mouth daily.      No current facility-administered medications for this visit.     Family History  Problem Relation Age of Onset  . Depression Mother        ? onset in mid 8640s  . Hypertension Mother   . Asthma Father   . Diabetes Father   . Thyroid nodules Father        S/P thyroidectomy  . Heart disease Father        S/P defibrillator  . Cancer Maternal Grandmother        ?? female cancer  . Cancer Maternal Grandfather  esophageal  . Thyroid nodules Paternal Grandmother   . Colon cancer Neg Hx   . Breast cancer Neg Hx     ROS:  Pertinent items are noted in HPI.  Otherwise, a comprehensive ROS was negative.  Exam:   There were no vitals taken for this visit.   Ht Readings from Last 3 Encounters:  07/06/17 5\' 8"  (1.727 m)  05/11/17 5\' 8"  (1.727 m)  12/01/16 5\' 8"  (1.727 m)    General appearance: alert, cooperative and appears stated age Head: Normocephalic, without obvious abnormality, atraumatic Neck: no adenopathy, supple, symmetrical, trachea midline and thyroid {EXAM; THYROID:18604} Lungs: clear to auscultation bilaterally Breasts: {Exam; breast:13139::"normal appearance, no masses or tenderness"} Heart: regular rate and rhythm Abdomen: soft, non-tender; no masses,  no organomegaly Extremities: extremities  normal, atraumatic, no cyanosis or edema Skin: Skin color, texture, turgor normal. No rashes or lesions Lymph nodes: Cervical, supraclavicular, and axillary nodes normal. No abnormal inguinal nodes palpated Neurologic: Grossly normal   Pelvic: External genitalia:  no lesions              Urethra:  normal appearing urethra with no masses, tenderness or lesions              Bartholin's and Skene's: normal                 Vagina: normal appearing vagina with normal color and discharge, no lesions              Cervix: {exam; cervix:14595}              Pap taken: {yes no:314532} Bimanual Exam:  Uterus:  {exam; uterus:12215}              Adnexa: {exam; adnexa:12223}               Rectovaginal: Confirms               Anus:  normal sphincter tone, no lesions  Chaperone present: ***  A:  Well Woman with normal exam  P:   Reviewed health and wellness pertinent to exam  Pap smear: {YES NO:22349}  {plan; gyn:5269::"mammogram","pap smear","return annually or prn"}  An After Visit Summary was printed and given to the patient.

## 2018-05-17 ENCOUNTER — Encounter: Payer: Self-pay | Admitting: Internal Medicine

## 2019-10-26 ENCOUNTER — Encounter: Payer: Self-pay | Admitting: Certified Nurse Midwife
